# Patient Record
Sex: Female | Born: 1965
Health system: Southern US, Community
[De-identification: ages and names within clinical notes are randomized; demographics above are authoritative.]

## PROBLEM LIST (undated history)

## (undated) DIAGNOSIS — R7303 Prediabetes: Secondary | ICD-10-CM

## (undated) DIAGNOSIS — R011 Cardiac murmur, unspecified: Secondary | ICD-10-CM

## (undated) DIAGNOSIS — U071 COVID-19: Secondary | ICD-10-CM

## (undated) DIAGNOSIS — E785 Hyperlipidemia, unspecified: Secondary | ICD-10-CM

## (undated) DIAGNOSIS — R0600 Dyspnea, unspecified: Secondary | ICD-10-CM

## (undated) DIAGNOSIS — M1990 Unspecified osteoarthritis, unspecified site: Secondary | ICD-10-CM

## (undated) DIAGNOSIS — I10 Essential (primary) hypertension: Secondary | ICD-10-CM

---
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
## 2019-04-30 ENCOUNTER — Inpatient Hospital Stay (HOSPITAL_COMMUNITY)
Admission: AD | Admit: 2019-04-30 | Discharge: 2019-05-05 | DRG: 177 | Disposition: A | Discharge status: Home Health | Payer: No Typology Code available for payment source | Source: Other Acute Inpatient Hospital | Attending: Internal Medicine | Admitting: Internal Medicine

## 2019-04-30 ENCOUNTER — Other Ambulatory Visit: Payer: Self-pay

## 2019-04-30 ENCOUNTER — Encounter (HOSPITAL_COMMUNITY): Payer: Self-pay

## 2019-04-30 DIAGNOSIS — Z79899 Other long term (current) drug therapy: Secondary | ICD-10-CM

## 2019-04-30 DIAGNOSIS — E785 Hyperlipidemia, unspecified: Secondary | ICD-10-CM | POA: Diagnosis present

## 2019-04-30 DIAGNOSIS — Z6841 Body Mass Index (BMI) 40.0 and over, adult: Secondary | ICD-10-CM | POA: Diagnosis not present

## 2019-04-30 DIAGNOSIS — R7989 Other specified abnormal findings of blood chemistry: Secondary | ICD-10-CM | POA: Diagnosis not present

## 2019-04-30 DIAGNOSIS — Z794 Long term (current) use of insulin: Secondary | ICD-10-CM | POA: Diagnosis not present

## 2019-04-30 DIAGNOSIS — M1990 Unspecified osteoarthritis, unspecified site: Secondary | ICD-10-CM | POA: Diagnosis present

## 2019-04-30 DIAGNOSIS — J1289 Other viral pneumonia: Secondary | ICD-10-CM | POA: Diagnosis present

## 2019-04-30 DIAGNOSIS — J189 Pneumonia, unspecified organism: Secondary | ICD-10-CM

## 2019-04-30 DIAGNOSIS — R791 Abnormal coagulation profile: Secondary | ICD-10-CM | POA: Diagnosis not present

## 2019-04-30 DIAGNOSIS — U071 COVID-19: Secondary | ICD-10-CM | POA: Diagnosis present

## 2019-04-30 DIAGNOSIS — I5022 Chronic systolic (congestive) heart failure: Secondary | ICD-10-CM | POA: Diagnosis not present

## 2019-04-30 DIAGNOSIS — Z8249 Family history of ischemic heart disease and other diseases of the circulatory system: Secondary | ICD-10-CM

## 2019-04-30 DIAGNOSIS — J1282 Pneumonia due to coronavirus disease 2019: Secondary | ICD-10-CM | POA: Diagnosis present

## 2019-04-30 DIAGNOSIS — I10 Essential (primary) hypertension: Secondary | ICD-10-CM | POA: Diagnosis present

## 2019-04-30 DIAGNOSIS — J9601 Acute respiratory failure with hypoxia: Secondary | ICD-10-CM | POA: Diagnosis present

## 2019-04-30 DIAGNOSIS — R7303 Prediabetes: Secondary | ICD-10-CM | POA: Diagnosis present

## 2019-04-30 DIAGNOSIS — E7800 Pure hypercholesterolemia, unspecified: Secondary | ICD-10-CM | POA: Diagnosis not present

## 2019-04-30 DIAGNOSIS — M7989 Other specified soft tissue disorders: Secondary | ICD-10-CM | POA: Diagnosis not present

## 2019-04-30 DIAGNOSIS — R609 Edema, unspecified: Secondary | ICD-10-CM | POA: Diagnosis not present

## 2019-04-30 HISTORY — DX: Pneumonia due to coronavirus disease 2019: J12.82

## 2019-04-30 HISTORY — DX: Dyspnea, unspecified: R06.00

## 2019-04-30 HISTORY — DX: Cardiac murmur, unspecified: R01.1

## 2019-04-30 HISTORY — DX: Hyperlipidemia, unspecified: E78.5

## 2019-04-30 HISTORY — DX: Prediabetes: R73.03

## 2019-04-30 HISTORY — DX: Unspecified osteoarthritis, unspecified site: M19.90

## 2019-04-30 HISTORY — DX: Essential (primary) hypertension: I10

## 2019-04-30 HISTORY — DX: COVID-19: U07.1

## 2019-04-30 MED ORDER — ENOXAPARIN SODIUM 40 MG/0.4ML ~~LOC~~ SOLN: 40.0000 mg | SUBCUTANEOUS | Status: DC

## 2019-04-30 MED ORDER — ENOXAPARIN SODIUM 40 MG/0.4ML ~~LOC~~ SOLN
40.0000 mg | Freq: Every day | SUBCUTANEOUS | Status: DC
  Administered 2019-04-30: 23:00:00 40 mg via SUBCUTANEOUS
  Filled 2019-04-30: qty 0.4

## 2019-04-30 MED ORDER — CHLORHEXIDINE GLUCONATE CLOTH 2 % EX PADS
6.0000 | MEDICATED_PAD | Freq: Every day | CUTANEOUS | Status: DC
  Administered 2019-04-30 – 2019-05-04 (×5): 6 via TOPICAL

## 2019-04-30 NOTE — H&P (Signed)
History and Physical    Jamie Burton ELF:[id number] DOB: 11/25/1965 DOA: 04/30/2019  PCP: Richardean Chimera, MD   Patient coming from: [facility]  I have personally briefly reviewed patient's old medical records in [facility]  Chief Complaint: Shortness of breath.  HPI: Jamie Burton is a 53 y.o. female with medical history significant of osteoarthritis, history of dyspnea, history of childhood heart murmur, hyperlipidemia, hypertension, prediabetes, morbid obesity who was transferred from [facility] due to progressively worse dyspnea for the past 3 days associated with fatigue, malaise, chills, night sweats, but no fever.  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  She stated that she felt dyspneic on exertion for the past 3 days, but started to feel dyspneic on rest today.  She felt so dyspneic that she had to call EMS and was found to be hypoxic with an O2 sat of 70% on room air.  [place] 4 LPM only improve her O2 sat of 78%.  She had to be placed on HF [place] at 15 LPM to get her O2 sat improved to 92%.  She denies rhinorrhea, sore throat, productive or dry cough, chest pain or pressure, palpitations, dizziness, PND, orthopnea, but states she gets frequent lower extremity edema.  She has felt mildly nauseous, but denies abdominal pain, emesis, diarrhea, constipation, melena or hematochezia.  No dysuria, frequency or hematuria.  Denies polyuria, polydipsia, polyphagia or blurred vision.    ED Course: In the ER, the patient was found hypotensive with a systolic in the 70s.  She received 2 L of normal saline and her blood pressure improved to 110/70 mmHg after the fluid bolus.  She also received Solu-Medrol, full dose Lovenox for suspected DVT/PE, but this was unable to be worked up at the originating facility due to the patient being unable to fit in the CT scanner and having an inconclusive venous Doppler of the lower extremities.  Her WBC, Hb and platelets were  6.4/13.6/196 respectively Sodium 135, potassium 3.8, chloride 97 and CO2 18 mmol/L. Glucose was 218, creatinine 2.15 and BUN 26 mg/dL.  Initial lactic lactic acid 4.7 then 2.1. Pro-BNP 11257.  ABG results were pH 7.45, PCO2 32, PO2 64, bicarbonate 22.2 with an O2 sat of 92.2% on 15 LPM HFNC.  Her EKG showed EKG Sinus tachycardia with diffuse abnormal T wave.  She received dexamethasone 6 mg and Solu-Medrol 125 mg IVP.  Cefepime 2 g IVPB. She received a dose of Lovenox of 140 milligrams SQ at 1734 and had received another 40 mg  earlier.   Review of Systems: As per HPI otherwise 10 point review of systems negative.   Past Medical History:  Diagnosis Date  . Arthritis   . Dyspnea   . Heart murmur    told she had a heart murmur as a child but unsure now  . Hyperlipidemia   . Hypertension   . Pneumonia due to COVID-19 virus 04/30/2019  . Pre-diabetes     History reviewed. No pertinent surgical history.   reports that she has never smoked. She has never used smokeless tobacco. She reports that she does not drink alcohol or use drugs.  No Known Allergies  Family medical history Nothing that she can remember on her parents. She is an only child. Multiple extended family members have a history of hypertension.  Prior to Admission medications   Medication Sig Start Date End Date Taking? Authorizing Provider  atenolol (TENORMIN) 50 MG tablet Take 50 mg by mouth  daily.   Yes [provider]  hydrochlorothiazide (HYDRODIURIL) 25 MG tablet Take 25 mg by mouth daily.   Yes [provider]  lovastatin (MEVACOR) 20 MG tablet Take 20 mg by mouth 2 (two) times daily.   Yes [provider]    Physical Exam: Vitals:   04/30/19 2016 04/30/19 2023 04/30/19 2100 04/30/19 2200  BP:  117/88 114/85 114/77  Pulse:  95 (!) 104 93  Resp:  (!) 28 (!) 21 (!) 34  Temp:  97.7 F (36.5 C)    TempSrc:  Oral    SpO2: 93% 96% 97% 96%  Height:  5\' 5"  (1.651 m)       Constitutional: NAD, calm, comfortable Eyes: PERRL, lids and conjunctivae normal ENMT: Mucous membranes are moist. Posterior pharynx clear of any exudate or lesions.  Neck: normal, supple, no masses, no thyromegaly Respiratory: Tachypneic in the mid 20s.  Decreased breath sounds with bibasilar crackles. No accessory muscle use.  Cardiovascular: Regular rate and rhythm, no murmurs / rubs / gallops. No pitting lower extremities edema.  Stage II lymphedema.  2+ pedal pulses. No carotid bruits.  Abdomen: Morbidly obese, nondistended.  Soft, no tenderness, no masses palpated. No hepatosplenomegaly. Bowel sounds positive.  Musculoskeletal: no clubbing / cyanosis.  Good ROM, no contractures. Normal muscle tone.  Skin: no rashes, lesions, ulcers on very limited dermatological examination. Neurologic: CN 2-12 grossly intact. Sensation intact, DTR normal. Strength 5/5 in all 4.  Psychiatric: Normal judgment and insight. Alert and oriented x 3. Normal mood.   Labs on Admission: I have personally reviewed following labs and imaging studies  CBC: No results for input(s): WBC, NEUTROABS, HGB, HCT, MCV, PLT in the last 168 hours. Basic Metabolic Panel: No results for input(s): NA, K, CL, CO2, GLUCOSE, BUN, CREATININE, CALCIUM, MG, PHOS in the last 168 hours. GFR: CrCl cannot be calculated (No successful lab value found.). Liver Function Tests: No results for input(s): AST, ALT, ALKPHOS, BILITOT, PROT, ALBUMIN in the last 168 hours. No results for input(s): LIPASE, AMYLASE in the last 168 hours. No results for input(s): AMMONIA in the last 168 hours. Coagulation Profile: No results for input(s): INR, PROTIME in the last 168 hours. Cardiac Enzymes: No results for input(s): CKTOTAL, CKMB, CKMBINDEX, TROPONINI in the last 168 hours. BNP (last 3 results) No results for input(s): PROBNP in the last 8760 hours. HbA1C: No results for input(s): HGBA1C in the last 72 hours. CBG: No results for input(s):  GLUCAP in the last 168 hours. Lipid Profile: No results for input(s): CHOL, HDL, LDLCALC, TRIG, CHOLHDL, LDLDIRECT in the last 72 hours. Thyroid Function Tests: No results for input(s): TSH, T4TOTAL, FREET4, T3FREE, THYROIDAB in the last 72 hours. Anemia Panel: No results for input(s): VITAMINB12, FOLATE, FERRITIN, TIBC, IRON, RETICCTPCT in the last 72 hours. Urine analysis: No results found for: COLORURINE, APPEARANCEUR, LABSPEC, PHURINE, GLUCOSEU, HGBUR, BILIRUBINUR, KETONESUR, PROTEINUR, UROBILINOGEN, NITRITE, LEUKOCYTESUR  Radiological Exams on Admission: No results found.  EKG: Independently reviewed.  Assessment/Plan Principal Problem:   Pneumonia due to COVID-19 virus Admit to ICU/inpatient. Continue supplemental oxygen. Bronchodilators every 6 hours. Continue dexamethasone 8 mg IVP daily. Continue daily Zithromax 500 mg IVPB x4 more days. Continue daily Rocephin 2 g IVPB x4 more days. Follow-up CBC, CMP, inflammatory markers.  Active Problems:   Elevated d-dimer Unable to get CTA originating facility. Doppler lower extremities was not conclusive. Continue full dose Lovenox for now. CT scanner at our facility is currently on maintenance. Check lower extremities Doppler later  today.    Hypertension Continue atenolol 50 mg p.o. daily. Continue HCTZ 25 mg p.o. daily. Monitor BP, HR, renal function electrolytes.    Hyperlipidemia Continue statin.    DVT prophylaxis: Lovenox full dose. Code Status: Full code. Family Communication:  Disposition Plan: Admit for COVID-19 pneumonia treatment. Consults called: Admission status: Inpatient/ICU.   Reubin Milan MD [facility]  If 7PM-7AM, please contact night-coverage [email or web address]  04/30/2019, 11:15 PM   This document was prepared using Dragon voice recognition software and may contain some unintended transcription errors.

## 2019-05-01 ENCOUNTER — Inpatient Hospital Stay (HOSPITAL_COMMUNITY): Payer: No Typology Code available for payment source

## 2019-05-01 ENCOUNTER — Encounter (HOSPITAL_COMMUNITY): Payer: Self-pay | Admitting: Internal Medicine

## 2019-05-01 ENCOUNTER — Inpatient Hospital Stay (HOSPITAL_COMMUNITY): Payer: 59

## 2019-05-01 DIAGNOSIS — R791 Abnormal coagulation profile: Secondary | ICD-10-CM

## 2019-05-01 DIAGNOSIS — R609 Edema, unspecified: Secondary | ICD-10-CM

## 2019-05-01 DIAGNOSIS — I5022 Chronic systolic (congestive) heart failure: Secondary | ICD-10-CM

## 2019-05-01 DIAGNOSIS — J9601 Acute respiratory failure with hypoxia: Secondary | ICD-10-CM

## 2019-05-01 DIAGNOSIS — R7989 Other specified abnormal findings of blood chemistry: Secondary | ICD-10-CM

## 2019-05-01 DIAGNOSIS — E7800 Pure hypercholesterolemia, unspecified: Secondary | ICD-10-CM

## 2019-05-01 DIAGNOSIS — I10 Essential (primary) hypertension: Secondary | ICD-10-CM

## 2019-05-01 DIAGNOSIS — M7989 Other specified soft tissue disorders: Secondary | ICD-10-CM

## 2019-05-01 LAB — FERRITIN
Ferritin: 345 ng/mL — ABNORMAL HIGH (ref 11–307)
Ferritin: 414 ng/mL — ABNORMAL HIGH (ref 11–307)

## 2019-05-01 LAB — CBC WITH DIFFERENTIAL/PLATELET
Abs Immature Granulocytes: 0.08 10*3/uL — ABNORMAL HIGH (ref 0.00–0.07)
Abs Immature Granulocytes: 0.11 10*3/uL — ABNORMAL HIGH (ref 0.00–0.07)
Basophils Absolute: 0 10*3/uL (ref 0.0–0.1)
Basophils Absolute: 0 10*3/uL (ref 0.0–0.1)
Basophils Relative: 0 %
Basophils Relative: 0 %
Eosinophils Absolute: 0 10*3/uL (ref 0.0–0.5)
Eosinophils Absolute: 0 10*3/uL (ref 0.0–0.5)
Eosinophils Relative: 0 %
Eosinophils Relative: 0 %
HCT: 42.8 % (ref 36.0–46.0)
HCT: 43.6 % (ref 36.0–46.0)
Hemoglobin: 13.4 g/dL (ref 12.0–15.0)
Hemoglobin: 13.7 g/dL (ref 12.0–15.0)
Immature Granulocytes: 1 %
Immature Granulocytes: 1 %
Lymphocytes Relative: 12 %
Lymphocytes Relative: 9 %
Lymphs Abs: 0.8 10*3/uL (ref 0.7–4.0)
Lymphs Abs: 1.1 10*3/uL (ref 0.7–4.0)
MCH: 26.7 pg (ref 26.0–34.0)
MCH: 26.9 pg (ref 26.0–34.0)
MCHC: 31.3 g/dL (ref 30.0–36.0)
MCHC: 31.4 g/dL (ref 30.0–36.0)
MCV: 84.8 fL (ref 80.0–100.0)
MCV: 85.8 fL (ref 80.0–100.0)
Monocytes Absolute: 0.5 10*3/uL (ref 0.1–1.0)
Monocytes Absolute: 1.1 10*3/uL — ABNORMAL HIGH (ref 0.1–1.0)
Monocytes Relative: 10 %
Monocytes Relative: 7 %
Neutro Abs: 5.5 10*3/uL (ref 1.7–7.7)
Neutro Abs: 8.9 10*3/uL — ABNORMAL HIGH (ref 1.7–7.7)
Neutrophils Relative %: 80 %
Neutrophils Relative %: 80 %
Platelets: 176 10*3/uL (ref 150–400)
Platelets: 180 10*3/uL (ref 150–400)
RBC: 4.99 MIL/uL (ref 3.87–5.11)
RBC: 5.14 MIL/uL — ABNORMAL HIGH (ref 3.87–5.11)
RDW: 14.6 % (ref 11.5–15.5)
RDW: 14.6 % (ref 11.5–15.5)
WBC: 11.2 10*3/uL — ABNORMAL HIGH (ref 4.0–10.5)
WBC: 6.9 10*3/uL (ref 4.0–10.5)
nRBC: 0 % (ref 0.0–0.2)
nRBC: 0.3 % — ABNORMAL HIGH (ref 0.0–0.2)

## 2019-05-01 LAB — COMPREHENSIVE METABOLIC PANEL
ALT: 36 U/L (ref 0–44)
AST: 41 U/L (ref 15–41)
Albumin: 3.6 g/dL (ref 3.5–5.0)
Alkaline Phosphatase: 72 U/L (ref 38–126)
Anion gap: 14 (ref 5–15)
BUN: 32 mg/dL — ABNORMAL HIGH (ref 6–20)
CO2: 22 mmol/L (ref 22–32)
Calcium: 8.7 mg/dL — ABNORMAL LOW (ref 8.9–10.3)
Chloride: 102 mmol/L (ref 98–111)
Creatinine, Ser: 1.46 mg/dL — ABNORMAL HIGH (ref 0.44–1.00)
GFR calc Af Amer: 48 mL/min — ABNORMAL LOW (ref >60)
GFR calc non Af Amer: 41 mL/min — ABNORMAL LOW (ref >60)
Glucose, Bld: 180 mg/dL — ABNORMAL HIGH (ref 70–99)
Potassium: 4.4 mmol/L (ref 3.5–5.1)
Sodium: 138 mmol/L (ref 135–145)
Total Bilirubin: 1.2 mg/dL (ref 0.3–1.2)
Total Protein: 8.9 g/dL — ABNORMAL HIGH (ref 6.5–8.1)

## 2019-05-01 LAB — MAGNESIUM
Magnesium: 2.6 mg/dL — ABNORMAL HIGH (ref 1.7–2.4)
Magnesium: 2.6 mg/dL — ABNORMAL HIGH (ref 1.7–2.4)

## 2019-05-01 LAB — C-REACTIVE PROTEIN
CRP: 11.2 mg/dL — ABNORMAL HIGH (ref <1.0)
CRP: 11.9 mg/dL — ABNORMAL HIGH (ref <1.0)

## 2019-05-01 LAB — GLUCOSE, CAPILLARY
Glucose-Capillary: 151 mg/dL — ABNORMAL HIGH (ref 70–99)
Glucose-Capillary: 162 mg/dL — ABNORMAL HIGH (ref 70–99)
Glucose-Capillary: 208 mg/dL — ABNORMAL HIGH (ref 70–99)

## 2019-05-01 LAB — TYPE AND SCREEN
ABO/RH(D): O POS
Antibody Screen: NEGATIVE

## 2019-05-01 LAB — LACTIC ACID, PLASMA: Lactic Acid, Venous: 2 mmol/L (ref 0.5–1.9)

## 2019-05-01 LAB — ECHOCARDIOGRAM LIMITED: Height: 65 in

## 2019-05-01 LAB — BRAIN NATRIURETIC PEPTIDE: B Natriuretic Peptide: 1317.4 pg/mL — ABNORMAL HIGH (ref 0.0–100.0)

## 2019-05-01 LAB — HEMOGLOBIN A1C
Hgb A1c MFr Bld: 6.6 % — ABNORMAL HIGH (ref 4.8–5.6)
Mean Plasma Glucose: 142.72 mg/dL

## 2019-05-01 LAB — PROCALCITONIN: Procalcitonin: 0.17 ng/mL

## 2019-05-01 LAB — HEPARIN LEVEL (UNFRACTIONATED): Heparin Unfractionated: 0.44 IU/mL (ref 0.30–0.70)

## 2019-05-01 LAB — HIV ANTIBODY (ROUTINE TESTING W REFLEX): HIV Screen 4th Generation wRfx: NONREACTIVE

## 2019-05-01 LAB — MRSA PCR SCREENING: MRSA by PCR: NEGATIVE

## 2019-05-01 LAB — PHOSPHORUS
Phosphorus: 3.2 mg/dL (ref 2.5–4.6)
Phosphorus: 3.5 mg/dL (ref 2.5–4.6)

## 2019-05-01 LAB — FIBRINOGEN: Fibrinogen: 419 mg/dL (ref 210–475)

## 2019-05-01 LAB — D-DIMER, QUANTITATIVE
D-Dimer, Quant: >20 ug/mL-FEU — ABNORMAL HIGH (ref 0.00–0.50)
D-Dimer, Quant: >20 ug/mL-FEU — ABNORMAL HIGH (ref 0.00–0.50)

## 2019-05-01 LAB — ABO/RH: ABO/RH(D): O POS

## 2019-05-01 MED ORDER — SODIUM CHLORIDE 0.9 % IV SOLN
200.0000 mg | Freq: Once | INTRAVENOUS | Status: DC
  Filled 2019-05-01: qty 40

## 2019-05-01 MED ORDER — SODIUM CHLORIDE 0.9 % IV SOLN
500.0000 mg | INTRAVENOUS | Status: DC
  Administered 2019-05-01 – 2019-05-02 (×2): 500 mg via INTRAVENOUS
  Filled 2019-05-01 (×2): qty 500

## 2019-05-01 MED ORDER — ENOXAPARIN SODIUM 150 MG/ML ~~LOC~~ SOLN: 1.0000 mg/kg | Freq: Every day | SUBCUTANEOUS | Status: DC

## 2019-05-01 MED ORDER — SODIUM CHLORIDE 0.9 % IV SOLN: 100.0000 mg | INTRAVENOUS | Status: DC

## 2019-05-01 MED ORDER — INSULIN ASPART 100 UNIT/ML ~~LOC~~ SOLN
0.0000 [IU] | Freq: Three times a day (TID) | SUBCUTANEOUS | Status: DC
  Administered 2019-05-01: 7 [IU] via SUBCUTANEOUS
  Administered 2019-05-01 – 2019-05-02 (×4): 4 [IU] via SUBCUTANEOUS
  Administered 2019-05-02: 18:00:00 7 [IU] via SUBCUTANEOUS
  Administered 2019-05-02: 11 [IU] via SUBCUTANEOUS
  Administered 2019-05-03 (×3): 4 [IU] via SUBCUTANEOUS
  Administered 2019-05-04: 13:00:00 7 [IU] via SUBCUTANEOUS
  Administered 2019-05-04: 3 [IU] via SUBCUTANEOUS
  Administered 2019-05-04: 7 [IU] via SUBCUTANEOUS
  Administered 2019-05-05: 3 [IU] via SUBCUTANEOUS

## 2019-05-01 MED ORDER — SODIUM CHLORIDE 0.9 % IV SOLN
200.0000 mg | Freq: Once | INTRAVENOUS | Status: AC
  Administered 2019-05-01: 200 mg via INTRAVENOUS
  Filled 2019-05-01: qty 40

## 2019-05-01 MED ORDER — IPRATROPIUM-ALBUTEROL 20-100 MCG/ACT IN AERS
2.0000 | INHALATION_SPRAY | Freq: Four times a day (QID) | RESPIRATORY_TRACT | Status: DC | PRN
  Filled 2019-05-01: qty 4

## 2019-05-01 MED ORDER — SODIUM CHLORIDE 0.9 % IV SOLN
100.0000 mg | INTRAVENOUS | Status: DC
  Administered 2019-05-02 – 2019-05-04 (×3): 100 mg via INTRAVENOUS
  Filled 2019-05-01 (×4): qty 20

## 2019-05-01 MED ORDER — SODIUM CHLORIDE 0.9% FLUSH
10.0000 mL | INTRAVENOUS | Status: DC | PRN
  Administered 2019-05-05: 09:00:00 10 mL
  Filled 2019-05-01: qty 40

## 2019-05-01 MED ORDER — METHYLPREDNISOLONE SODIUM SUCC 125 MG IJ SOLR
60.0000 mg | Freq: Three times a day (TID) | INTRAMUSCULAR | Status: DC
  Administered 2019-05-01 – 2019-05-02 (×4): 60 mg via INTRAVENOUS
  Filled 2019-05-01 (×4): qty 2

## 2019-05-01 MED ORDER — SODIUM CHLORIDE 0.9 % IV SOLN
2.0000 g | INTRAVENOUS | Status: DC
  Administered 2019-05-01 – 2019-05-02 (×2): 2 g via INTRAVENOUS
  Filled 2019-05-01 (×3): qty 20

## 2019-05-01 MED ORDER — SODIUM CHLORIDE 0.9% FLUSH
10.0000 mL | Freq: Two times a day (BID) | INTRAVENOUS | Status: DC
  Administered 2019-05-01 – 2019-05-02 (×4): 10 mL

## 2019-05-01 MED ORDER — HEPARIN (PORCINE) 25000 UT/250ML-% IV SOLN
1650.0000 [IU]/h | INTRAVENOUS | Status: AC
  Administered 2019-05-01 – 2019-05-03 (×4): 1650 [IU]/h via INTRAVENOUS
  Filled 2019-05-01 (×6): qty 250

## 2019-05-01 MED ORDER — HEPARIN BOLUS VIA INFUSION
2000.0000 [IU] | Freq: Once | INTRAVENOUS | Status: AC
  Administered 2019-05-01: 2000 [IU] via INTRAVENOUS
  Filled 2019-05-01: qty 2000

## 2019-05-01 MED ORDER — PROCHLORPERAZINE EDISYLATE 10 MG/2ML IJ SOLN
10.0000 mg | INTRAMUSCULAR | Status: DC | PRN
  Filled 2019-05-01: qty 2

## 2019-05-01 MED ORDER — DEXAMETHASONE SODIUM PHOSPHATE 10 MG/ML IJ SOLN: 8.0000 mg | INTRAMUSCULAR | Status: DC

## 2019-05-01 MED ORDER — IPRATROPIUM-ALBUTEROL 20-100 MCG/ACT IN AERS
2.0000 | INHALATION_SPRAY | Freq: Four times a day (QID) | RESPIRATORY_TRACT | Status: DC
  Administered 2019-05-01: 06:00:00 2 via RESPIRATORY_TRACT
  Filled 2019-05-01: qty 4

## 2019-05-01 MED ORDER — FUROSEMIDE 10 MG/ML IJ SOLN
40.0000 mg | Freq: Once | INTRAMUSCULAR | Status: AC
  Administered 2019-05-01: 14:00:00 40 mg via INTRAVENOUS
  Filled 2019-05-01: qty 4

## 2019-05-01 NOTE — Progress Notes (Signed)
ANTICOAGULATION CONSULT NOTE - Initial Consult  Pharmacy Consult for heparin Indication: R/o VTE treatment  No Known Allergies  Patient Measurements: Height: 5\' 5"  (165.1 cm) Weight: (!) 480 lb (217.7 kg) IBW/kg (Calculated) : 57  Actual body weight: 217 Heparin Dosing Weight: 114 kg   Vital Signs: Temp: 98.6 F (37 C) (09/02 1900) Temp Source: Oral (09/02 1900) BP: 106/71 (09/02 1900) Pulse Rate: 78 (09/02 1600)  Labs: Recent Labs    04/30/19 2330 05/01/19 0855 05/01/19 2040  HGB 13.7 13.4  --   HCT 43.6 42.8  --   PLT 176 180  --   HEPARINUNFRC  --   --  0.44  CREATININE 1.46*  --   --     Estimated Creatinine Clearance: 87.3 mL/min (A) (by C-G formula based on SCr of 1.46 mg/dL (H)).   Medical History: Past Medical History:  Diagnosis Date  . Arthritis   . Dyspnea   . Heart murmur    told she had a heart murmur as a child but unsure now  . Hyperlipidemia   . Hypertension   . Pneumonia due to COVID-19 virus 04/30/2019  . Pre-diabetes     Medications:  Medications Prior to Admission  Medication Sig Dispense Refill Last Dose  . atenolol (TENORMIN) 50 MG tablet Take 50 mg by mouth daily.   04/28/2019 at 0700  . hydrochlorothiazide (HYDRODIURIL) 25 MG tablet Take 25 mg by mouth daily.   04/28/2019 at Unknown time  . ibuprofen (ADVIL) 200 MG tablet Take 200 mg by mouth every 6 (six) hours as needed for moderate pain.   unk  . lovastatin (MEVACOR) 20 MG tablet Take 20 mg by mouth 2 (two) times daily.   04/28/2019 at Unknown time    Assessment: 68 YOF with concern for LE VTE to start on IV heparin. H/H and Plt wnl. D-dimer > 20. SCr 1.46. Of note she received a total of Lovenox 180 mg yesterday evening.   PM update:  HL is 0.44 therapeutic  No line or bleeding issues per RN   Goal of Therapy:  Heparin level 0.3-0.7 units/ml Monitor platelets by anticoagulation protocol: Yes   Plan:   Continue  IV heparin at 1650 units/hr  F/u HL with AM labs    Monitor daily HL, CBC and s/s of bleeding    Royetta Asal, PharmD, BCPS 05/01/2019 10:06 PM

## 2019-05-01 NOTE — Progress Notes (Signed)
  Echocardiogram 2D Echocardiogram limited has been performed.  Darlina Sicilian M 05/01/2019, 10:23 AM

## 2019-05-01 NOTE — Progress Notes (Signed)
NAME:  Jamie Burton, MRN:  [id number], DOB:  28-Aug-1966, LOS: 1 ADMISSION DATE:  04/30/2019, CONSULTATION DATE:  9/2 REFERRING MD:  Olevia Bowens, CHIEF COMPLAINT:  Dyspnea   Brief History   53 y/o female admitted from [facility] on 9/2 in the setting of severe acute respiratory failure with hypoxemia from COVID 19 pneumonia.   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  She says that she started to develop symptoms approximately 10 days ago, initially sinus congestion and postnasal drip.  She had a test done 6 days ago which was positive for COVID-19 pneumonia.  There has been an outbreak in her facility.  Over the last 3 days she developed worsening cough and shortness of breath.  Yesterday she had to go to the emergency room because her shortness of breath was so severe.  They are in the emergency room she was noted to have profound hypoxemia.  She also complains of worsening leg swelling over the last several days. Overnight she was moved to the intensive care unit, she says that she is resting comfortably now.  Her breathing has improved. Past Medical History  Osteoarthritis HTN HLD Pre-diabetes  Significant Hospital Events   9/1 admission  Consults:  PCCM  Procedures:    Significant Diagnostic Tests:  9/2 Bilateral lower ext dop> limited study, no DVT 9/2 Echo> LVEF > 65%, cavity size normal, RV normal size/function, valves OK  Micro Data:  8/27 SARS COV 2 EDEN > positive  Antimicrobials:  9/2 solumedrol 9/2 remdesivir  9/1 ceftriaxone >  9/1 azithro >   Interim history/subjective:  As above  Objective   Blood pressure 108/74, pulse (!) 102, temperature 98.2 F (36.8 C), temperature source Oral, resp. rate (!) 31, height 5\' 5"  (1.651 m), SpO2 (!) 89 %.    FiO2 (%):  [85 %-100 %] 85 %  No intake or  output data in the 24 hours ending 05/01/19 0743 There were no vitals filed for this visit.  Examination:  General:  Resting comfortably in chair HENT: NCAT OP clear PULM: CTA B, normal effort CV: RRR, no mgr GI: BS+, soft, nontender MSK: normal bulk and tone Neuro: awake, alert, no distress, MAEW   Resolved Hospital Problem list     Assessment & Plan:  COVID 19 pneumonia causing acute respiratory failure with hypoxemia Start remdedsivir Hold off on actemra: she seems to be getting better Continue solumedrol for now, consider lowering dose Tolerate periods of hypoxemia, goal at rest is greater than 85% SaO2, with movement ideally above 75% Decision for intubation should be based on a change in mental status or physical evidence of ventilatory failure such as nasal flaring, accessory muscle use, paradoxical breathing Out of bed to chair as able Incentive spirometry is important, use every hour Prone positioning while in bed  Elevated d-dimer with leg swelling: agree with vascular  > heparin infusion for now > will discuss with TRH, now knowing neg DVT study and echo, doubt thromboembolism and could probably change back to lovenox 0.5mg  subq bid  CHF? > check echo > Lasix x1 dose   Best practice:  Diet: advance diet Pain/Anxiety/Delirium protocol (if indicated): n/a VAP protocol (if indicated): n/a DVT prophylaxis: heparin GI prophylaxis: n/a Glucose control: SSI Mobility: out of bed Code Status: full Family Communication: will discuss with TRH Disposition:  consider transfer to PCU tomorrow  Labs   CBC: Recent Labs  Lab 04/30/19 2330  WBC 6.9  NEUTROABS 5.5  HGB 13.7  HCT 43.6  MCV 84.8  PLT 176    Basic Metabolic Panel: Recent Labs  Lab 04/30/19 2330  NA 138  K 4.4  CL 102  CO2 22  GLUCOSE 180*  BUN 32*  CREATININE 1.46*  CALCIUM 8.7*  MG 2.6*  PHOS 3.5   GFR: CrCl cannot be calculated (Unknown ideal weight.). Recent Labs  Lab 04/30/19  2330  WBC 6.9    Liver Function Tests: Recent Labs  Lab 04/30/19 2330  AST 41  ALT 36  ALKPHOS 72  BILITOT 1.2  PROT 8.9*  ALBUMIN 3.6   No results for input(s): LIPASE, AMYLASE in the last 168 hours. No results for input(s): AMMONIA in the last 168 hours.  ABG No results found for: PHART, PCO2ART, PO2ART, HCO3, TCO2, ACIDBASEDEF, O2SAT   Coagulation Profile: No results for input(s): INR, PROTIME in the last 168 hours.  Cardiac Enzymes: No results for input(s): CKTOTAL, CKMB, CKMBINDEX, TROPONINI in the last 168 hours.  HbA1C: No results found for: HGBA1C  CBG: No results for input(s): GLUCAP in the last 168 hours.  Review of Systems:   Gen: Denies fever, chills, weight change, fatigue, night sweats HEENT: Denies blurred vision, double vision, hearing loss, tinnitus, + sinus congestion, + rhinorrhea, sore throat, neck stiffness, dysphagia PULM: per HPI CV: Denies chest pain, edema, orthopnea, paroxysmal nocturnal dyspnea, palpitations GI: Denies abdominal pain, nausea, vomiting, diarrhea, hematochezia, melena, constipation, change in bowel habits GU: Denies dysuria, hematuria, polyuria, oliguria, urethral discharge Endocrine: Denies hot or cold intolerance, polyuria, polyphagia or appetite change Derm: Denies rash, dry skin, scaling or peeling skin change Heme: Denies easy bruising, bleeding, bleeding gums Neuro: Denies headache, numbness, weakness, slurred speech, loss of memory or consciousness   Past Medical History  She,  has a past medical history of Arthritis, Dyspnea, Heart murmur, Hyperlipidemia, Hypertension, Pneumonia due to COVID-19 virus (04/30/2019), and Pre-diabetes.   Surgical History   History reviewed. No pertinent surgical history.   Social History   reports that she has never smoked. She has never used smokeless tobacco. She reports that she does not drink alcohol or use drugs.   Family History   Her family history includes Hypertension in an  other family member.   Allergies No Known Allergies   Home Medications  Prior to Admission medications   Medication Sig Start Date End Date Taking? Authorizing Provider  atenolol (TENORMIN) 50 MG tablet Take 50 mg by mouth daily.   Yes [provider]  hydrochlorothiazide (HYDRODIURIL) 25 MG tablet Take 25 mg by mouth daily.   Yes [provider]  lovastatin (MEVACOR) 20 MG tablet Take 20 mg by mouth 2 (two) times daily.   Yes [provider]     Critical care time: 40 minutes     Heber CarolinaBrent Sudie Bandel, MD [place] [facility] Pager: [phone] Cell: [phone] If no response, call [phone]

## 2019-05-01 NOTE — Progress Notes (Signed)
Midline placed in the left upper arm/cephalic vein without difficulty. Good blood return.

## 2019-05-01 NOTE — Progress Notes (Signed)
Spoke with RN concerning emergency PIV needed, education provided. Will not place PIV at this time, RN aware to consult if patient looses PIV.

## 2019-05-01 NOTE — Progress Notes (Signed)
Updated pt's mother and aunt of pt's status. Informed pt's mother and aunt of new medications the pt is receiving and current oxygen therapy. Informed family that if any further questions were to arise that they should feel free to reach out. Pt's family stated understanding of information provided

## 2019-05-01 NOTE — Progress Notes (Addendum)
PROGRESS NOTE    Jamie Burton  YFV:[id number] DOB: 25-Apr-1966 DOA: 04/30/2019 PCP: Richardean Chimera, MD   Brief Narrative:  Jamie Burton is a 53 y.o. BF PMHx OA, dyspnea, childhood heart murmur, HTN, HLD,  prediabetes, morbid obesity,    Transferred from [facility] due to progressively worse dyspnea for the past 3 days associated with fatigue, malaise, chills, night sweats, but no fever.  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  She stated that she felt dyspneic on exertion for the past 3 days, but started to feel dyspneic on rest today.  She felt so dyspneic that she had to call EMS and was found to be hypoxic with an O2 sat of 70% on room air.  [place] 4 LPM only improve her O2 sat of 78%.  She had to be placed on HF [place] at 15 LPM to get her O2 sat improved to 92%.  She denies rhinorrhea, sore throat, productive or dry cough, chest pain or pressure, palpitations, dizziness, PND, orthopnea, but states she gets frequent lower extremity edema.  She has felt mildly nauseous, but denies abdominal pain, emesis, diarrhea, constipation, melena or hematochezia.  No dysuria, frequency or hematuria.  Denies polyuria, polydipsia, polyphagia or blurred vision.    ED Course: In the ER, the patient was found hypotensive with a systolic in the 70s.  She received 2 L of normal saline and her blood pressure improved to 110/70 mmHg after the fluid bolus.  She also received Solu-Medrol, full dose Lovenox for suspected DVT/PE, but this was unable to be worked up at the originating facility due to the patient being unable to fit in the CT scanner and having an inconclusive venous Doppler of the lower extremities.   Subjective: 9/2 states started feeling bad on Thursday.  Lives at home alone.  Positive cough, S OB, which were the symptoms that prompted her to seek treatment at [facility].    Assessment & Plan:   Principal Problem:   Pneumonia due to COVID-19 virus Active  Problems:   Hypertension   Hyperlipidemia   Elevated d-dimer  Acute respiratory failure with hypoxia/pneumonia due to COVID 19 virus - Patient with new onset O2 requirement (25 L/min), FiO2 70%, SPO2 95% -Titrate O2 to maintain SPO2> 89% -Solu-Medrol 60 mg TID - Patient with new extremely large onset O2, although no chest x-ray to show bilateral infiltrates given her huge O2 demand most likely will show signs of "pneumonia.  Start Remdesivir per pharmacy protocol - Combivent QID Recent Labs  Lab 04/30/19 2330 05/01/19 0155  CRP 11.9* 11.2*  ] Recent Labs  Lab 04/30/19 2330  DDIMER >20.00*  - PCXR; bilateral patchy opacification; see results below - Given patient's new O2 demand, and CRP > 7 qualifies for Actemra.  However patient O2 demand decreasing.  Therefore will hold at this time.   -Counseled patient use of Actemra is off label, patient stated negative history of hepatitis, TB, or need for immunosuppressant medication.  Stated if required she would except treatment with Actemra. -Afebrile last 24 hours, mildleukocytosis highly convinced that patient has a bacterial component to her pneumonia however will continue antibiotics until procalcitonin and lactic acid values returned.  Elevated d-dimer/suspected DVT/PE  - Unable to be worked up at [facility], secondary to patient's size -9/2 CT scanner at [facility] down -Continue heparin drip   HTN/CHF? - Per patient lower extremity swelling which would be consistent with CHF. - Echocardiogram pending - Not on diuretic  at home - Lasix 40 mg x 1 - Currently patient's BP controlled without medication monitor closely  HLD -Lipid panel pending     DVT prophylaxis: Heparin drip Code Status: Full Family Communication: 9/2 spoke with Pryor Curia (mother) counseled her on plan of care answered all questions Disposition Plan: TBD   Consultants:  PCCM     Procedures/Significant Events:  9/2 PCXR;-slight progression of  bibasilar airspace opacities compatible with COVID-19 pneumonia. -Cardiomegaly without failure.    I have personally reviewed and interpreted all radiology studies and my findings are as above.  VENTILATOR SETTINGS: HFNC O2 flow rate; 25 L/min FiO2; 70% SPO2; 94%   Cultures   Antimicrobials: Anti-infectives (From admission, onward)   Start     Stop   05/02/19 1000  remdesivir 100 mg in sodium chloride 0.9 % 250 mL IVPB     05/06/19 0959   05/02/19 0900  remdesivir 100 mg in sodium chloride 0.9 % 250 mL IVPB  Status:  Discontinued     05/01/19 0817   05/01/19 1200  cefTRIAXone (ROCEPHIN) 2 g in sodium chloride 0.9 % 100 mL IVPB     05/05/19 1159   05/01/19 1200  azithromycin (ZITHROMAX) 500 mg in sodium chloride 0.9 % 250 mL IVPB     05/05/19 1159   05/01/19 1000  remdesivir 200 mg in sodium chloride 0.9 % 250 mL IVPB     05/01/19 1059   05/01/19 0900  remdesivir 200 mg in sodium chloride 0.9 % 250 mL IVPB  Status:  Discontinued     05/01/19 0817       Devices    LINES / TUBES:      Continuous Infusions: . azithromycin    . cefTRIAXone (ROCEPHIN)  IV       Objective: Vitals:   05/01/19 0400 05/01/19 0445 05/01/19 0500 05/01/19 0600  BP: 111/85  119/73 110/77  Pulse:      Resp: (!) 31  (!) 31 (!) 31  Temp: 98.2 F (36.8 C)     TempSrc: Oral     SpO2: 97% 95% 95% 93%  Height:       No intake or output data in the 24 hours ending 05/01/19 0726 There were no vitals filed for this visit.  Examination:  General: A/O x4, positive acute respiratory distress Eyes: negative scleral hemorrhage, negative anisocoria, negative icterus ENT: Negative Runny nose, negative gingival bleeding, Neck:  Negative scars, masses, torticollis, lymphadenopathy, JVD Lungs: Tachypneic, decreased breath sounds diffusely, positive cough with deep inspiration, without wheezes or crackles Cardiovascular: Regular rate and rhythm without murmur gallop or rub normal S1 and S2  Abdomen: MORBIDLY OBESE, negative abdominal pain, nondistended, positive soft, bowel sounds, no rebound, no ascites, no appreciable mass Extremities: No significant cyanosis, clubbing, secondary to body habitus difficult to determine extremity edema but appears mildly edematous 1-2+  Skin: Negative rashes, lesions, ulcers Psychiatric:  Negative depression, negative anxiety, negative fatigue, negative mania  Central nervous system:  Cranial nerves II through XII intact, tongue/uvula midline, all extremities muscle strength 5/5, sensation intact throughout, negative dysarthria, negative expressive aphasia, negative receptive aphasia.  .     Data Reviewed: Care during the described time interval was provided by me .  I have reviewed this patient's available data, including medical history, events of note, physical examination, and all test results as part of my evaluation.   CBC: Recent Labs  Lab 04/30/19 2330  WBC 6.9  NEUTROABS 5.5  HGB 13.7  HCT 43.6  MCV 84.8  PLT 176   Basic Metabolic Panel: Recent Labs  Lab 04/30/19 2330  NA 138  K 4.4  CL 102  CO2 22  GLUCOSE 180*  BUN 32*  CREATININE 1.46*  CALCIUM 8.7*  MG 2.6*  PHOS 3.5   GFR: CrCl cannot be calculated (Unknown ideal weight.). Liver Function Tests: Recent Labs  Lab 04/30/19 2330  AST 41  ALT 36  ALKPHOS 72  BILITOT 1.2  PROT 8.9*  ALBUMIN 3.6   No results for input(s): LIPASE, AMYLASE in the last 168 hours. No results for input(s): AMMONIA in the last 168 hours. Coagulation Profile: No results for input(s): INR, PROTIME in the last 168 hours. Cardiac Enzymes: No results for input(s): CKTOTAL, CKMB, CKMBINDEX, TROPONINI in the last 168 hours. BNP (last 3 results) No results for input(s): PROBNP in the last 8760 hours. HbA1C: No results for input(s): HGBA1C in the last 72 hours. CBG: No results for input(s): GLUCAP in the last 168 hours. Lipid Profile: No results for input(s): CHOL, HDL, LDLCALC,  TRIG, CHOLHDL, LDLDIRECT in the last 72 hours. Thyroid Function Tests: No results for input(s): TSH, T4TOTAL, FREET4, T3FREE, THYROIDAB in the last 72 hours. Anemia Panel: Recent Labs    04/30/19 2330  FERRITIN 414*   Urine analysis: No results found for: COLORURINE, APPEARANCEUR, LABSPEC, PHURINE, GLUCOSEU, HGBUR, BILIRUBINUR, KETONESUR, PROTEINUR, UROBILINOGEN, NITRITE, LEUKOCYTESUR Sepsis Labs: @LABRCNTIP (procalcitonin:4,lacticidven:4)  ) Recent Results (from the past 240 hour(s))  MRSA PCR Screening     Status: None   Collection Time: 04/30/19 11:05 PM   Specimen: Nasopharyngeal  Result Value Ref Range Status   MRSA by PCR NEGATIVE NEGATIVE Final    Comment:        The GeneXpert MRSA Assay (FDA approved for NASAL specimens only), is one component of a comprehensive MRSA colonization surveillance program. It is not intended to diagnose MRSA infection nor to guide or monitor treatment for MRSA infections. Performed at [facility], [address] [place], [place] [address]          Radiology Studies: No results found.      Scheduled Meds: . Chlorhexidine Gluconate Cloth  6 each Topical Daily  . dexamethasone (DECADRON) injection  8 mg Intravenous Q24H  . enoxaparin (LOVENOX) injection  1 mg/kg Subcutaneous Q2200  . insulin aspart  0-20 Units Subcutaneous TID WC  . Ipratropium-Albuterol  2 puff Inhalation Q6H   Continuous Infusions: . azithromycin    . cefTRIAXone (ROCEPHIN)  IV       LOS: 1 day   The patient is critically ill with multiple organ systems failure and requires high complexity decision making for assessment and support, frequent evaluation and titration of therapies, application of advanced monitoring technologies and extensive interpretation of multiple databases. Critical Care Time devoted to patient care services described in this note  Time spent: 40 minutes     Lanah Steines, Roselind MessierURTIS J, MD [facility] Pager  [phone]  If 7PM-7AM, please contact night-coverage [email or web address] Password [email or web address] 05/01/2019, 7:26 AM

## 2019-05-01 NOTE — Progress Notes (Signed)
ANTICOAGULATION CONSULT NOTE - Initial Consult  Pharmacy Consult for heparin Indication: R/o VTE treatment  No Known Allergies  Patient Measurements: Height: 5\' 5"  (165.1 cm) IBW/kg (Calculated) : 57  Actual body weight: 217 Heparin Dosing Weight: 114 kg   Vital Signs: Temp: 97.8 F (36.6 C) (09/02 0800) Temp Source: Oral (09/02 0800) BP: 135/76 (09/02 1000) Pulse Rate: 92 (09/02 0800)  Labs: Recent Labs    04/30/19 2330 05/01/19 0855  HGB 13.7 13.4  HCT 43.6 42.8  PLT 176 180  CREATININE 1.46*  --     CrCl cannot be calculated (Unknown ideal weight.).   Medical History: Past Medical History:  Diagnosis Date  . Arthritis   . Dyspnea   . Heart murmur    told she had a heart murmur as a child but unsure now  . Hyperlipidemia   . Hypertension   . Pneumonia due to COVID-19 virus 04/30/2019  . Pre-diabetes     Medications:  Medications Prior to Admission  Medication Sig Dispense Refill Last Dose  . atenolol (TENORMIN) 50 MG tablet Take 50 mg by mouth daily.   04/28/2019 at 0700  . hydrochlorothiazide (HYDRODIURIL) 25 MG tablet Take 25 mg by mouth daily.   04/28/2019 at Unknown time  . ibuprofen (ADVIL) 200 MG tablet Take 200 mg by mouth every 6 (six) hours as needed for moderate pain.   unk  . lovastatin (MEVACOR) 20 MG tablet Take 20 mg by mouth 2 (two) times daily.   04/28/2019 at Unknown time    Assessment: 38 YOF with concern for LE VTE to start on IV heparin. H/H and Plt wnl. D-dimer > 20. SCr 1.46. Of note she received a total of Lovenox 180 mg yesterday evening.   Goal of Therapy:  Heparin level 0.3-0.7 units/ml Monitor platelets by anticoagulation protocol: Yes   Plan:  -Heparin 2500 units IV bolus, then start IV heparin at 1650 units/hr -F/u 6 hr HL -Monitor daily HL, CBC and s/s of bleeding   Albertina Parr, PharmD., BCPS Clinical Pharmacist Clinical phone for 05/01/19 until 5pm: [phone]

## 2019-05-02 LAB — MAGNESIUM: Magnesium: 2.4 mg/dL (ref 1.7–2.4)

## 2019-05-02 LAB — COMPREHENSIVE METABOLIC PANEL
ALT: 37 U/L (ref 0–44)
AST: 33 U/L (ref 15–41)
Albumin: 3.4 g/dL — ABNORMAL LOW (ref 3.5–5.0)
Alkaline Phosphatase: 70 U/L (ref 38–126)
Anion gap: 14 (ref 5–15)
BUN: 41 mg/dL — ABNORMAL HIGH (ref 6–20)
CO2: 25 mmol/L (ref 22–32)
Calcium: 8.8 mg/dL — ABNORMAL LOW (ref 8.9–10.3)
Chloride: 103 mmol/L (ref 98–111)
Creatinine, Ser: 1.22 mg/dL — ABNORMAL HIGH (ref 0.44–1.00)
GFR calc Af Amer: 59 mL/min — ABNORMAL LOW (ref >60)
GFR calc non Af Amer: 51 mL/min — ABNORMAL LOW (ref >60)
Glucose, Bld: 154 mg/dL — ABNORMAL HIGH (ref 70–99)
Potassium: 4.4 mmol/L (ref 3.5–5.1)
Sodium: 142 mmol/L (ref 135–145)
Total Bilirubin: 0.6 mg/dL (ref 0.3–1.2)
Total Protein: 7.6 g/dL (ref 6.5–8.1)

## 2019-05-02 LAB — CBC WITH DIFFERENTIAL/PLATELET
Abs Immature Granulocytes: 0.17 10*3/uL — ABNORMAL HIGH (ref 0.00–0.07)
Basophils Absolute: 0 10*3/uL (ref 0.0–0.1)
Basophils Relative: 0 %
Eosinophils Absolute: 0 10*3/uL (ref 0.0–0.5)
Eosinophils Relative: 0 %
HCT: 42.2 % (ref 36.0–46.0)
Hemoglobin: 13 g/dL (ref 12.0–15.0)
Immature Granulocytes: 1 %
Lymphocytes Relative: 8 %
Lymphs Abs: 1.1 10*3/uL (ref 0.7–4.0)
MCH: 26.6 pg (ref 26.0–34.0)
MCHC: 30.8 g/dL (ref 30.0–36.0)
MCV: 86.3 fL (ref 80.0–100.0)
Monocytes Absolute: 0.6 10*3/uL (ref 0.1–1.0)
Monocytes Relative: 4 %
Neutro Abs: 11.4 10*3/uL — ABNORMAL HIGH (ref 1.7–7.7)
Neutrophils Relative %: 87 %
Platelets: 215 10*3/uL (ref 150–400)
RBC: 4.89 MIL/uL (ref 3.87–5.11)
RDW: 14.6 % (ref 11.5–15.5)
WBC: 13.2 10*3/uL — ABNORMAL HIGH (ref 4.0–10.5)
nRBC: 0.2 % (ref 0.0–0.2)

## 2019-05-02 LAB — D-DIMER, QUANTITATIVE: D-Dimer, Quant: >20 ug/mL-FEU — ABNORMAL HIGH (ref 0.00–0.50)

## 2019-05-02 LAB — PHOSPHORUS: Phosphorus: 3.9 mg/dL (ref 2.5–4.6)

## 2019-05-02 LAB — PROCALCITONIN: Procalcitonin: 0.23 ng/mL

## 2019-05-02 LAB — LIPID PANEL
Cholesterol: 187 mg/dL (ref 0–200)
HDL: 36 mg/dL — ABNORMAL LOW (ref >40)
LDL Cholesterol: 127 mg/dL — ABNORMAL HIGH (ref 0–99)
Total CHOL/HDL Ratio: 5.2 RATIO
Triglycerides: 122 mg/dL (ref <150)
VLDL: 24 mg/dL (ref 0–40)

## 2019-05-02 LAB — GLUCOSE, CAPILLARY
Glucose-Capillary: 161 mg/dL — ABNORMAL HIGH (ref 70–99)
Glucose-Capillary: 155 mg/dL — ABNORMAL HIGH (ref 70–99)
Glucose-Capillary: 287 mg/dL — ABNORMAL HIGH (ref 70–99)
Glucose-Capillary: 215 mg/dL — ABNORMAL HIGH (ref 70–99)
Glucose-Capillary: 173 mg/dL — ABNORMAL HIGH (ref 70–99)

## 2019-05-02 LAB — FERRITIN: Ferritin: 329 ng/mL — ABNORMAL HIGH (ref 11–307)

## 2019-05-02 LAB — C-REACTIVE PROTEIN: CRP: 3.5 mg/dL — ABNORMAL HIGH (ref <1.0)

## 2019-05-02 LAB — HEPARIN LEVEL (UNFRACTIONATED): Heparin Unfractionated: 0.45 IU/mL (ref 0.30–0.70)

## 2019-05-02 MED ORDER — METHYLPREDNISOLONE SODIUM SUCC 125 MG IJ SOLR
60.0000 mg | Freq: Two times a day (BID) | INTRAMUSCULAR | Status: DC
  Administered 2019-05-02: 22:00:00 60 mg via INTRAVENOUS
  Filled 2019-05-02: qty 2

## 2019-05-02 NOTE — Progress Notes (Signed)
PROGRESS NOTE                                                                                                                                                                                                             Patient Demographics:    Jamie Burton, is a 53 y.o. female, DOB - 09/05/1965, SPQ:[id number]  Outpatient Primary MD for the patient is Caryl Bis, MD    LOS - 2  Admit date - 04/30/2019    CC - SOB     Brief Narrative  Jamie Burton a 53 y.o.BF PMHx OA, dyspnea, childhood heart murmur, HTN, HLD,  prediabetes, morbid obesity,    [facility] the past 3 days associated with fatigue, malaise, chills, night sweats, but no fever. She works at a [employer] home and tested positive on 04/26/2027, was diagnosed with COVID-19 pneumonitis with extremely high d-dimer and transferred here to ICU for further care.   Subjective:    Shantrell Placzek today has, No headache, No chest pain, No abdominal pain - No Nausea, No new weakness tingling or numbness, improved Cough & SOB.     Assessment  & Plan :     1. Acute Hypoxic Resp. Failure due to Acute Covid 19 Viral Pneumonitis during the ongoing 2020 Covid 19 Pandemic - she was treated with IV steroids + Remdisvir along with full anticoagulation due to extremely high d-dimer.  Renal function precluded to CT angiogram.  Lower extremity venous duplex was not a high quality study but was negative.  Echocardiogram did not show any RV dysfunction.  At this time this line of treatment will be continued as d-dimer continues to be extremely high, she could have a lower extremity or intra-abdominal clot which could explain her extremely high d-dimer, for now plan will be to complete IV Remdisvir, continue steroids and full anticoagulation.  She will be discharged on oral anticoagulation for at least 3 months with  outpatient repeat ultrasound by PCP and repeat d-dimer checks in the outpatient setting until she stabilizes.  [place]    04/30/19 2330 05/01/19 0155 05/01/19 3335  05/02/19 0633  DDIMER >20.00*  --  >20.00* >20.00*  FERRITIN 414*  --  345* 329*  CRP 11.9* 11.2*  --  3.5*    No results found for: SARSCOV2NAA   Hepatic Function Latest Ref Rng & Units 05/02/2019 04/30/2019  Total Protein 6.5 - 8.1 g/dL 7.6 8.9(H)  Albumin 3.5 - 5.0 g/dL 3.4(L) 3.6  AST 15 - 41 U/L 33 41  ALT 0 - 44 U/L 37 36  Alk Phosphatase 38 - 126 U/L 70 72  Total Bilirubin 0.3 - 1.2 mg/dL 0.6 1.2        Component Value Date/Time   BNP 1,317.4 (H) 04/30/2019 2330      2.  Extremely elevated d-dimer.  See above.  Outpatient hematology follow-up also recommended.  3.  Morbid obesity.  Follow with PCP for weight loss.  4.  Chronic diastolic dysfunction EF 17%.  Currently compensated.  5.  Hypertension.  On beta-blocker.      Condition - Fair  Family Communication  :  None  Code Status :  Full  Diet :   Diet Order            Diet regular Room service appropriate? Yes; Fluid consistency: Thin  Diet effective now               Disposition Plan  :  Home in 3-4 days  Consults  :  PCCM  Procedures  :  Venous US -  Negative  TTE -        1. The left ventricle has hyperdynamic systolic function, with an ejection fraction of >65%. The cavity size was normal. There is moderately increased left ventricular wall thickness. No evidence of left ventricular regional wall motion abnormalities.  2. The right ventricle has normal systolc function. The cavity was normal. There is no increase in right ventricular wall thickness. Right ventricular systolic pressure is normal with an estimated pressure of 36.0 mmHg.  3. The aortic valve was not well visualized.  4. Pulmonic valve regurgitation was not assessed by color flow Doppler.    PUD Prophylaxis : None  DVT Prophylaxis  :     Heparin gtt  Lab Results  Component Value Date   PLT 215 05/02/2019    Inpatient Medications  Scheduled Meds:  Chlorhexidine Gluconate Cloth  6 each Topical Daily   insulin aspart  0-20 Units Subcutaneous TID WC   methylPREDNISolone (SOLU-MEDROL) injection  60 mg Intravenous TID   sodium chloride flush  10-40 mL Intracatheter Q12H   Continuous Infusions:  azithromycin Stopped (05/01/19 1407)   cefTRIAXone (ROCEPHIN)  IV Stopped (05/01/19 1255)   heparin 1,650 Units/hr (05/02/19 0407)   remdesivir 100 mg in NS 250 mL     PRN Meds:.Ipratropium-Albuterol, prochlorperazine, sodium chloride flush  Antibiotics  :    Anti-infectives (From admission, onward)   Start     Dose/Rate Route Frequency Ordered Stop   05/02/19 1000  remdesivir 100 mg in sodium chloride 0.9 % 250 mL IVPB     100 mg 500 mL/hr over 30 Minutes Intravenous Every 24 hours 05/01/19 0817 05/06/19 0959   05/02/19 0900  remdesivir 100 mg in sodium chloride 0.9 % 250 mL IVPB  Status:  Discontinued     100 mg 500 mL/hr over 30 Minutes Intravenous Every 24 hours 05/01/19 0805 05/01/19 0817   05/01/19 1200  cefTRIAXone (ROCEPHIN) 2 g in sodium chloride 0.9 % 100 mL IVPB     2 g 200 mL/hr over 30  Minutes Intravenous Every 24 hours 05/01/19 0452 05/05/19 1159   05/01/19 1200  azithromycin (ZITHROMAX) 500 mg in sodium chloride 0.9 % 250 mL IVPB     500 mg 250 mL/hr over 60 Minutes Intravenous Every 24 hours 05/01/19 0452 05/05/19 1159   05/01/19 1000  remdesivir 200 mg in sodium chloride 0.9 % 250 mL IVPB     200 mg 500 mL/hr over 30 Minutes Intravenous Once 05/01/19 0817 05/01/19 1059   05/01/19 0900  remdesivir 200 mg in sodium chloride 0.9 % 250 mL IVPB  Status:  Discontinued     200 mg 500 mL/hr over 30 Minutes Intravenous Once 05/01/19 0805 05/01/19 0817       Time Spent in minutes  30   Lala Lund M.D on 05/02/2019 at 10:13 AM  To page go to [email or web address] - password [email or web address]  [facility] -   Office  [phone]    See all Orders from today for further details    Objective:   Vitals:   05/02/19 0300 05/02/19 0400 05/02/19 0550 05/02/19 0724  BP: 116/76 99/70 120/82 114/70  Pulse:   65 64  Resp: (!) 26 (!) 26 (!) 26 (!) 27  Temp:  97.7 F (36.5 C) 98 F (36.7 C) 97.6 F (36.4 C)  TempSrc:  Oral Oral Oral  SpO2: 92% 93% 92% 93%  Weight:      Height:        Wt Readings from Last 3 Encounters:  05/01/19 (!) 217.7 kg     Intake/Output Summary (Last 24 hours) at 05/02/2019 1013 Last data filed at 05/02/2019 0550 Gross per 24 hour  Intake 961.03 ml  Output 1120 ml  Net -158.97 ml     Physical Exam  Awake Alert, Oriented X 3, No new F.N deficits, Normal affect [place].AT,PERRAL Supple Neck,No JVD, No cervical lymphadenopathy appriciated.  Symmetrical Chest wall movement, Good air movement bilaterally, CTAB RRR,No Gallops,Rubs or new Murmurs, No Parasternal Heave +ve B.Sounds, Abd Soft, No tenderness, No organomegaly appriciated, No rebound - guarding or rigidity. No Cyanosis, Clubbing or edema, No new Rash or bruise       Data Review:    CBC Recent Labs  Lab 04/30/19 2330 05/01/19 0855 05/02/19 0633  WBC 6.9 11.2* 13.2*  HGB 13.7 13.4 13.0  HCT 43.6 42.8 42.2  PLT 176 180 215  MCV 84.8 85.8 86.3  MCH 26.7 26.9 26.6  MCHC 31.4 31.3 30.8  RDW 14.6 14.6 14.6  LYMPHSABS 0.8 1.1 1.1  MONOABS 0.5 1.1* 0.6  EOSABS 0.0 0.0 0.0  BASOSABS 0.0 0.0 0.0    Chemistries  Recent Labs  Lab 04/30/19 2330 05/01/19 0855 05/02/19 0633  NA 138  --  142  K 4.4  --  4.4  CL 102  --  103  CO2 22  --  25  GLUCOSE 180*  --  154*  BUN 32*  --  41*  CREATININE 1.46*  --  1.22*  CALCIUM 8.7*  --  8.8*  MG 2.6* 2.6* 2.4  AST 41  --  33  ALT 36  --  37  ALKPHOS 72  --  70  BILITOT 1.2  --  0.6   ------------------------------------------------------------------------------------------------------------------ Recent Labs    05/02/19 0633  CHOL 187  HDL 36*    LDLCALC 127*  TRIG 122  CHOLHDL 5.2    Lab Results  Component Value Date   HGBA1C 6.6 (H) 05/01/2019   ------------------------------------------------------------------------------------------------------------------ No results for input(s): TSH, T4TOTAL, T3FREE, THYROIDAB  in the last 72 hours.  Invalid input(s): FREET3  Cardiac Enzymes No results for input(s): CKMB, TROPONINI, MYOGLOBIN in the last 168 hours.  Invalid input(s): CK ------------------------------------------------------------------------------------------------------------------    Component Value Date/Time   BNP 1,317.4 (H) 04/30/2019 2330    Micro Results Recent Results (from the past 240 hour(s))  MRSA PCR Screening     Status: None   Collection Time: 04/30/19 11:05 PM   Specimen: Nasopharyngeal  Result Value Ref Range Status   MRSA by PCR NEGATIVE NEGATIVE Final    Comment:        The GeneXpert MRSA Assay (FDA approved for NASAL specimens only), is one component of a comprehensive MRSA colonization surveillance program. It is not intended to diagnose MRSA infection nor to guide or monitor treatment for MRSA infections. Performed at [facility], [place] [address] [place],  [address]     Radiology Reports Dg Chest Port 1 View  Result Date: 05/01/2019 CLINICAL DATA:  Pneumonia. EXAM: PORTABLE CHEST 1 VIEW COMPARISON:  One-view chest x-ray 04/30/2019 at [facility]. FINDINGS: Heart is enlarged. Lung volumes are low. Progressive bibasilar airspace opacities are present. There is no edema or effusion. IMPRESSION: 1. Slight progression of bibasilar airspace opacities compatible with COVID-19 pneumonia. 2. Cardiomegaly without failure. 3. Low lung volumes. Electronically Signed   By: San Morelle M.D.   On: 05/01/2019 08:54   Vas [place] Lower Extremity Venous (dvt)  Result Date: 05/01/2019  Lower Venous Study Indications: Swelling, and elevated dimer. Other Indications:  COVID 19. Anticoagulation: Lovenox. Comparison Study: Patient had a Lower extremity venous performed at another                   facility on 03/30/19 the results were inconclusive. The exam                   performed today only the Common Femoral proximal and mid were                   adequately visualized with no evidence of DVT in these areas.                   Very limited exam due to patient habitus Performing Technologist: Darlina Sicilian RDCS  Examination Guidelines: A complete evaluation includes B-mode imaging, spectral Doppler, color Doppler, and power Doppler as needed of all accessible portions of each vessel. Bilateral testing is considered an integral part of a complete examination. Limited examinations for reoccurring indications may be performed as noted.  +---------+---------------+---------+-----------+----------+--------------+  RIGHT     Compressibility Phasicity Spontaneity Properties Thrombus Aging  +---------+---------------+---------+-----------+----------+--------------+  CFV       Full                                                             +---------+---------------+---------+-----------+----------+--------------+  SFJ       Full                                                             +---------+---------------+---------+-----------+----------+--------------+  FV Prox   Full                                                             +---------+---------------+---------+-----------+----------+--------------+  FV Mid    Full                                                             +---------+---------------+---------+-----------+----------+--------------+  FV Distal                                                  Not visualized  +---------+---------------+---------+-----------+----------+--------------+  POP                                                        Not visualized  +---------+---------------+---------+-----------+----------+--------------+  PTV       Full                                                              +---------+---------------+---------+-----------+----------+--------------+  Soleal                                                     Not visualized  +---------+---------------+---------+-----------+----------+--------------+   +---------+---------------+---------+-----------+----------+-------------------+  LEFT      Compressibility Phasicity Spontaneity Properties Thrombus Aging       +---------+---------------+---------+-----------+----------+-------------------+  CFV       Full            Yes       Yes                                         +---------+---------------+---------+-----------+----------+-------------------+  SFJ       Full                                                                  +---------+---------------+---------+-----------+----------+-------------------+  FV Prox   Full                                                                  +---------+---------------+---------+-----------+----------+-------------------+  FV Mid    Full                                                                  +---------+---------------+---------+-----------+----------+-------------------+  FV Distal                                                  able to visualize                                                                in long axis with                                                                color flow due to                                                                the depth of the                                                                 vein unable to                                                                   compress             +---------+---------------+---------+-----------+----------+-------------------+  PTV       Full                                                                  +---------+---------------+---------+-----------+----------+-------------------+  PERO                                                        Not visualized       +---------+---------------+---------+-----------+----------+-------------------+    Summary: Right: Portions of this examination were limited- see technologist comments above. There is no evidence of deep vein thrombosis in the lower extremity. Left: Portions of this examination were limited- see technologist comments above. There is no evidence of deep vein thrombosis in the lower extremity.  *See table(s) above for measurements and  observations. Electronically signed by Curt Jews MD on 05/01/2019 at 4:38:48 PM.    Final

## 2019-05-02 NOTE — Progress Notes (Signed)
Patient arrived to PCU rm 9134 around 0510. On 6L [place] satting low 90s. HR and BP WNL. No complaints of CP, SOB, abd pain, N/V at this time. Education on flutter valve and IS given to the patient. Mother called and updated on patient's transfer.

## 2019-05-02 NOTE — Progress Notes (Signed)
ANTICOAGULATION CONSULT NOTE - Follow Up Consult  Pharmacy Consult for heparin Indication: R/o VTE treatment  No Known Allergies  Patient Measurements: Height: 5\' 5"  (165.1 cm) Weight: (!) 480 lb (217.7 kg) IBW/kg (Calculated) : 57  Actual body weight: 217 Heparin Dosing Weight: 114 kg   Vital Signs: Temp: 97.6 F (36.4 C) (09/03 0724) Temp Source: Oral (09/03 0724) BP: 114/70 (09/03 0724) Pulse Rate: 64 (09/03 0724)  Labs: Recent Labs    04/30/19 2330 05/01/19 0855 05/01/19 2040 05/02/19 0633  HGB 13.7 13.4  --  13.0  HCT 43.6 42.8  --  42.2  PLT 176 180  --  215  HEPARINUNFRC  --   --  0.44 0.45  CREATININE 1.46*  --   --  1.22*    Estimated Creatinine Clearance: 104.5 mL/min (A) (by C-G formula based on SCr of 1.22 mg/dL (H)).   Medical History: Past Medical History:  Diagnosis Date  . Arthritis   . Dyspnea   . Heart murmur    told she had a heart murmur as a child but unsure now  . Hyperlipidemia   . Hypertension   . Pneumonia due to COVID-19 virus 04/30/2019  . Pre-diabetes     Medications:  Medications Prior to Admission  Medication Sig Dispense Refill Last Dose  . atenolol (TENORMIN) 50 MG tablet Take 50 mg by mouth daily.   04/28/2019 at 0700  . hydrochlorothiazide (HYDRODIURIL) 25 MG tablet Take 25 mg by mouth daily.   04/28/2019 at Unknown time  . ibuprofen (ADVIL) 200 MG tablet Take 200 mg by mouth every 6 (six) hours as needed for moderate pain.   unk  . lovastatin (MEVACOR) 20 MG tablet Take 20 mg by mouth 2 (two) times daily.   04/28/2019 at Unknown time    Assessment: 78 YOF with concern for LE VTE on IV heparin. LE venous duplex was inconclusive.  H/H and Plt wnl. D-dimer > 20. SCr 1.46>1.26. HL this AM remains therapeutic at 0.45 on 1650 units/hr  Goal of Therapy:  Heparin level 0.3-0.7 units/ml Monitor platelets by anticoagulation protocol: Yes   Plan:   Continue  IV heparin at 1650 units/hr  Monitor daily HL, CBC and s/s of  bleeding   Planning to discharge on oral anticoagulant per MD    Albertina Parr, PharmD., BCPS Clinical Pharmacist Clinical phone for 05/02/19 until 5pm: [phone]

## 2019-05-02 NOTE — Progress Notes (Signed)
Patient's mother was called and updated about plan of care and patient status. Patient was requesting to have clothes brought to the hospital for when she is discharged. Patient's mother was instructed on policy and when to drop of clothes. All questions answered.

## 2019-05-03 LAB — CBC WITH DIFFERENTIAL/PLATELET
Abs Immature Granulocytes: 0.35 10*3/uL — ABNORMAL HIGH (ref 0.00–0.07)
Basophils Absolute: 0 10*3/uL (ref 0.0–0.1)
Basophils Relative: 0 %
Eosinophils Absolute: 0 10*3/uL (ref 0.0–0.5)
Eosinophils Relative: 0 %
HCT: 40.8 % (ref 36.0–46.0)
Hemoglobin: 12.2 g/dL (ref 12.0–15.0)
Immature Granulocytes: 2 %
Lymphocytes Relative: 7 %
Lymphs Abs: 1 10*3/uL (ref 0.7–4.0)
MCH: 26.1 pg (ref 26.0–34.0)
MCHC: 29.9 g/dL — ABNORMAL LOW (ref 30.0–36.0)
MCV: 87.4 fL (ref 80.0–100.0)
Monocytes Absolute: 0.9 10*3/uL (ref 0.1–1.0)
Monocytes Relative: 6 %
Neutro Abs: 12.2 10*3/uL — ABNORMAL HIGH (ref 1.7–7.7)
Neutrophils Relative %: 85 %
Platelets: 185 10*3/uL (ref 150–400)
RBC: 4.67 MIL/uL (ref 3.87–5.11)
RDW: 14.7 % (ref 11.5–15.5)
WBC: 14.4 10*3/uL — ABNORMAL HIGH (ref 4.0–10.5)
nRBC: 0.1 % (ref 0.0–0.2)

## 2019-05-03 LAB — FERRITIN: Ferritin: 246 ng/mL (ref 11–307)

## 2019-05-03 LAB — COMPREHENSIVE METABOLIC PANEL
ALT: 32 U/L (ref 0–44)
AST: 22 U/L (ref 15–41)
Albumin: 3.2 g/dL — ABNORMAL LOW (ref 3.5–5.0)
Alkaline Phosphatase: 61 U/L (ref 38–126)
Anion gap: 12 (ref 5–15)
BUN: 35 mg/dL — ABNORMAL HIGH (ref 6–20)
CO2: 25 mmol/L (ref 22–32)
Calcium: 8.2 mg/dL — ABNORMAL LOW (ref 8.9–10.3)
Chloride: 104 mmol/L (ref 98–111)
Creatinine, Ser: 1.01 mg/dL — ABNORMAL HIGH (ref 0.44–1.00)
GFR calc Af Amer: >60 mL/min (ref >60)
GFR calc non Af Amer: >60 mL/min (ref >60)
Glucose, Bld: 181 mg/dL — ABNORMAL HIGH (ref 70–99)
Potassium: 4.4 mmol/L (ref 3.5–5.1)
Sodium: 141 mmol/L (ref 135–145)
Total Bilirubin: 0.4 mg/dL (ref 0.3–1.2)
Total Protein: 7.1 g/dL (ref 6.5–8.1)

## 2019-05-03 LAB — MAGNESIUM: Magnesium: 2.4 mg/dL (ref 1.7–2.4)

## 2019-05-03 LAB — PROCALCITONIN: Procalcitonin: 0.12 ng/mL

## 2019-05-03 LAB — HEPARIN LEVEL (UNFRACTIONATED): Heparin Unfractionated: 0.45 IU/mL (ref 0.30–0.70)

## 2019-05-03 LAB — GLUCOSE, CAPILLARY
Glucose-Capillary: 170 mg/dL — ABNORMAL HIGH (ref 70–99)
Glucose-Capillary: 176 mg/dL — ABNORMAL HIGH (ref 70–99)
Glucose-Capillary: 191 mg/dL — ABNORMAL HIGH (ref 70–99)
Glucose-Capillary: 173 mg/dL — ABNORMAL HIGH (ref 70–99)

## 2019-05-03 LAB — LACTATE DEHYDROGENASE: LDH: 375 U/L — ABNORMAL HIGH (ref 98–192)

## 2019-05-03 LAB — C-REACTIVE PROTEIN: CRP: 1.5 mg/dL — ABNORMAL HIGH (ref <1.0)

## 2019-05-03 LAB — BRAIN NATRIURETIC PEPTIDE: B Natriuretic Peptide: 484.7 pg/mL — ABNORMAL HIGH (ref 0.0–100.0)

## 2019-05-03 LAB — D-DIMER, QUANTITATIVE: D-Dimer, Quant: 19.29 ug/mL-FEU — ABNORMAL HIGH (ref 0.00–0.50)

## 2019-05-03 MED ORDER — AMLODIPINE BESYLATE 5 MG PO TABS
10.0000 mg | ORAL_TABLET | Freq: Every day | ORAL | Status: DC
  Administered 2019-05-03 – 2019-05-05 (×3): 10 mg via ORAL
  Filled 2019-05-03 (×4): qty 2

## 2019-05-03 MED ORDER — METHYLPREDNISOLONE SODIUM SUCC 40 MG IJ SOLR
40.0000 mg | Freq: Two times a day (BID) | INTRAMUSCULAR | Status: DC
  Administered 2019-05-03 – 2019-05-05 (×5): 40 mg via INTRAVENOUS
  Filled 2019-05-03 (×5): qty 1

## 2019-05-03 NOTE — Progress Notes (Signed)
ANTICOAGULATION CONSULT NOTE - Follow Up Consult  Pharmacy Consult for heparin Indication: R/o VTE treatment  No Known Allergies  Patient Measurements: Height: 5\' 5"  (165.1 cm) Weight: (!) 480 lb (217.7 kg) IBW/kg (Calculated) : 57  Actual body weight: 217 Heparin Dosing Weight: 114 kg   Vital Signs: Temp: 97.3 F (36.3 C) (09/04 0417) Temp Source: Oral (09/04 0417) BP: 130/78 (09/04 0417) Pulse Rate: 88 (09/04 0417)  Labs: Recent Labs    04/30/19 2330 05/01/19 0855 05/01/19 2040 05/02/19 0633 05/03/19 0302  HGB 13.7 13.4  --  13.0 12.2  HCT 43.6 42.8  --  42.2 40.8  PLT 176 180  --  215 185  HEPARINUNFRC  --   --  0.44 0.45 0.45  CREATININE 1.46*  --   --  1.22* 1.01*    Estimated Creatinine Clearance: 126.2 mL/min (A) (by C-G formula based on SCr of 1.01 mg/dL (H)).   Medical History: Past Medical History:  Diagnosis Date  . Arthritis   . Dyspnea   . Heart murmur    told she had a heart murmur as a child but unsure now  . Hyperlipidemia   . Hypertension   . Pneumonia due to COVID-19 virus 04/30/2019  . Pre-diabetes     Medications:  Medications Prior to Admission  Medication Sig Dispense Refill Last Dose  . atenolol (TENORMIN) 50 MG tablet Take 50 mg by mouth daily.   04/28/2019 at 0700  . hydrochlorothiazide (HYDRODIURIL) 25 MG tablet Take 25 mg by mouth daily.   04/28/2019 at Unknown time  . ibuprofen (ADVIL) 200 MG tablet Take 200 mg by mouth every 6 (six) hours as needed for moderate pain.   unk  . lovastatin (MEVACOR) 20 MG tablet Take 20 mg by mouth 2 (two) times daily.   04/28/2019 at Unknown time    Assessment: 26 YOF with concern for LE VTE on IV heparin. LE venous duplex was inconclusive.    Today, 05/03/19   HL is 0.45, therapeutic   D-dimer 19.29  SCr improving now 1.01   Hgb 12.2, plt 185, WNL   No line or bleeding issues per RN    Goal of Therapy:  Heparin level 0.3-0.7 units/ml Monitor platelets by anticoagulation protocol:  Yes   Plan:   Continue  IV heparin at 1650 units/hr  Monitor daily HL, CBC and s/s of bleeding   Planning to discharge on oral anticoagulant per MD     Royetta Asal, PharmD, BCPS 05/03/2019 5:39 AM

## 2019-05-03 NOTE — Progress Notes (Signed)
SATURATION QUALIFICATIONS: (This note is used to comply with regulatory documentation for home oxygen)  Patient Saturations on Room Air at Rest = 84%  Patient Saturations on Room Air while Ambulating = NT-- pt was already 84% on RA at rest  Patient Saturations on 6 Liters of oxygen while Ambulating = 85%  Please briefly explain why patient needs home oxygen: Pt desaturates on RA at rest.  Wells Guiles B. Ellizabeth Dacruz, PT, DPT  Acute Rehabilitation [phone] pager [phone]) [phone] office  @ [facility]: [phone]

## 2019-05-03 NOTE — Plan of Care (Signed)
Patient sat up in chair all day and transfers from the bed to the commode with mild desaturation to the mid 80s. Patient reports using flutter valve and IS regularly but requires reinforcement. Patient asked about being discharged after the remdesivir is complete. This RN informed the patient that her medical status must be stable prior to discharge as well.  Problem: Education: Goal: Knowledge of risk factors and measures for prevention of condition will improve Outcome: Progressing   Problem: Coping: Goal: Psychosocial and spiritual needs will be supported Outcome: Progressing   Problem: Respiratory: Goal: Will maintain a patent airway Outcome: Progressing Goal: Complications related to the disease process, condition or treatment will be avoided or minimized Outcome: Progressing   Problem: Education: Goal: Knowledge of General Education information will improve Description: Including pain rating scale, medication(s)/side effects and non-pharmacologic comfort measures Outcome: Progressing   Problem: Health Behavior/Discharge Planning: Goal: Ability to manage health-related needs will improve Outcome: Progressing   Problem: Clinical Measurements: Goal: Ability to maintain clinical measurements within normal limits will improve Outcome: Progressing Goal: Will remain free from infection Outcome: Progressing Goal: Diagnostic test results will improve Outcome: Progressing Goal: Respiratory complications will improve Outcome: Progressing Goal: Cardiovascular complication will be avoided Outcome: Progressing   Problem: Activity: Goal: Risk for activity intolerance will decrease Outcome: Progressing   Problem: Nutrition: Goal: Adequate nutrition will be maintained Outcome: Progressing   Problem: Coping: Goal: Level of anxiety will decrease Outcome: Progressing   Problem: Elimination: Goal: Will not experience complications related to bowel motility Outcome:  Progressing Goal: Will not experience complications related to urinary retention Outcome: Progressing   Problem: Pain Managment: Goal: General experience of comfort will improve Outcome: Progressing   Problem: Safety: Goal: Ability to remain free from injury will improve Outcome: Progressing   Problem: Skin Integrity: Goal: Risk for impaired skin integrity will decrease Outcome: Progressing

## 2019-05-03 NOTE — Evaluation (Signed)
Physical Therapy Evaluation Patient Details Name: Jamie ManeSharon Pearce MRN: [id number] DOB: Aug 15, 1966 Today's Date: 05/03/2019   History of Present Illness  53 y.o. female admitted on 04/30/19 for SOB due to COVID 19 PNA.  Pt also with significantly elevated d-dimer, negative DVT, but suspicious due to high d-dimer, so placed on heparin drip.  Pt with significant PMH of pre diabetes, HTN, arthritis.    Clinical Impression  Pt desaturates to 85% on 6 L O2 [place] during gait.  DOE 3/4, 3-4 mins of recovery as measured by nellcor finger probe.  Pt reports compliance with IS and flutter valve and would benefit from OT consult.  PT will continue to follow acutely for safe mobility progression    Follow Up Recommendations No PT follow up    Equipment Recommendations  Other (comment)(home O2, shower chair)    Recommendations for Other Services   OT consult    Precautions / Restrictions Precautions Precautions: Other (comment) Precaution Comments: monitor O2      Mobility  Bed Mobility               General bed mobility comments: Pt was OOB in the recliner chair.   Transfers Overall transfer level: Needs assistance Equipment used: Rolling walker (2 wheeled) Transfers: Sit to/from Stand Sit to Stand: Supervision         General transfer comment: supervision for safety  Ambulation/Gait Ambulation/Gait assistance: Min guard Gait Distance (Feet): 15 Feet(x3) Assistive device: None Gait Pattern/deviations: Step-through pattern;Staggering left;Staggering right Gait velocity: decreased   General Gait Details: Pt with mildly staggering gait pattern.  O2 sats decrease to 85% on 6 L O2 [place] with gait taking 3-4 mins to recover back to 90 and decreased DOE.          Balance Overall balance assessment: Mild deficits observed, not formally tested                                           Pertinent Vitals/Pain Pain Assessment: No/denies pain    Home Living  Family/patient expects to be discharged to:: Private residence Living Arrangements: Alone Available Help at Discharge: Family;Available PRN/intermittently Type of Home: House Home Access: Level entry     Home Layout: One level Home Equipment: None      Prior Function Level of Independence: Independent         Comments: Pt works in [employer], [employer] at [employer]     Hand Dominance   Dominant Hand: Right    Extremity/Trunk Assessment   Upper Extremity Assessment Upper Extremity Assessment: Generalized weakness    Lower Extremity Assessment Lower Extremity Assessment: Generalized weakness    Cervical / Trunk Assessment Cervical / Trunk Assessment: Normal  Communication   Communication: No difficulties  Cognition Arousal/Alertness: Awake/alert Behavior During Therapy: WFL for tasks assessed/performed Overall Cognitive Status: Within Functional Limits for tasks assessed                                               Assessment/Plan    PT Assessment Patient needs continued PT services  PT Problem List Decreased strength;Decreased activity tolerance;Decreased balance;Decreased mobility;Decreased knowledge of precautions;Cardiopulmonary status limiting activity;Obesity       PT Treatment Interventions DME instruction;Stair training;Gait training;Functional mobility training;Therapeutic activities;Therapeutic exercise;Balance training;Patient/family education  PT Goals (Current goals can be found in the Care Plan section)  Acute Rehab PT Goals Patient Stated Goal: to be able to go home by Monday PT Goal Formulation: With patient Time For Goal Achievement: 05/17/19 Potential to Achieve Goals: Good    Frequency Min 3X/week           AM-PAC PT "6 Clicks" Mobility  Outcome Measure Help needed turning from your back to your side while in a flat bed without using bedrails?: A Little Help needed moving from lying on your back to sitting on the  side of a flat bed without using bedrails?: A Little Help needed moving to and from a bed to a chair (including a wheelchair)?: A Little Help needed standing up from a chair using your arms (e.g., wheelchair or bedside chair)?: None Help needed to walk in hospital room?: A Little Help needed climbing 3-5 steps with a railing? : A Little 6 Click Score: 19    End of Session Equipment Utilized During Treatment: Oxygen(6L O2 [place]) Activity Tolerance: Patient limited by fatigue Patient left: in chair;with call bell/phone within reach   PT Visit Diagnosis: Muscle weakness (generalized) (M62.81);Difficulty in walking, not elsewhere classified (R26.2)    Time: 2094-7096 PT Time Calculation (min) (ACUTE ONLY): 35 min   Charges:       Wells Guiles B. Suzie Vandam, PT, DPT  Acute Rehabilitation [phone] pager [phone]) [phone] office  @ [facility]: [phone]   PT Evaluation $PT Eval Moderate Complexity: 1 Mod PT Treatments $Gait Training: 8-22 mins        05/03/2019, 4:08 PM

## 2019-05-03 NOTE — Progress Notes (Signed)
PROGRESS NOTE                                                                                                                                                                                                             Patient Demographics:    Jamie Burton, is a 53 y.o. female, DOB - 05-31-1966, BMW:[id number]  Outpatient Primary MD for the patient is Caryl Bis, MD    LOS - 3  Admit date - 04/30/2019    CC - SOB     Brief Narrative  Jamie Burton a 53 y.o.BF PMHx OA, dyspnea, childhood heart murmur, HTN, HLD,  prediabetes, morbid obesity,    [facility] the [facility] 3 days associated with fatigue, malaise, chills, night sweats, but no fever. She works at a [employer] home and tested positive on 04/26/2027, was diagnosed with COVID-19 pneumonitis with extremely high d-dimer and transferred here to ICU for further care.   Subjective:   Patient in bed, appears comfortable, denies any headache, no fever, no chest pain or pressure, no shortness of breath , no abdominal pain. No focal weakness.   Assessment  & Plan :     1. Acute Hypoxic Resp. Failure due to Acute Covid 19 Viral Pneumonitis during the ongoing 2020 Covid 19 Pandemic - she was treated with IV steroids + Remdisvir along with full anticoagulation due to extremely high d-dimer.  Renal function precluded to CT angiogram.  Lower extremity venous duplex was not a high quality study but was negative.  Echocardiogram did not show any RV dysfunction.  At this time this line of treatment will be continued as d-dimer continues to be extremely high, she could have a lower extremity or intra-abdominal clot which could explain her extremely high d-dimer, for now plan will be to complete IV Remdisvir, continue steroids and full anticoagulation.  She will be discharged on oral anticoagulation for at least 3-6  months with outpatient repeat ultrasound by PCP and repeat d-dimer checks in the outpatient setting until she stabilizes.  COVID-19 Labs  Recent Labs    05/01/19 0155 05/01/19 0855 05/02/19 0633 05/03/19 0302  DDIMER  --  >20.00* >20.00* 19.29*  FERRITIN  --  345* 329* 246  LDH  --   --   --  375*  CRP 11.2*  --  3.5* 1.5*    No results found for: SARSCOV2NAA   Hepatic Function Latest Ref Rng & Units 05/03/2019 05/02/2019 04/30/2019  Total Protein 6.5 - 8.1 g/dL 7.1 7.6 8.9(H)  Albumin 3.5 - 5.0 g/dL 3.2(L) 3.4(L) 3.6  AST 15 - 41 U/L 22 33 41  ALT 0 - 44 U/L 32 37 36  Alk Phosphatase 38 - 126 U/L 61 70 72  Total Bilirubin 0.3 - 1.2 mg/dL 0.4 0.6 1.2        Component Value Date/Time   BNP 484.7 (H) 05/03/2019 0302      2.  Extremely elevated d-dimer.  See above.  Outpatient hematology follow-up also recommended.  3.  Morbid obesity.  Follow with PCP for weight loss.  4.  Chronic diastolic dysfunction EF 86%.  Currently compensated.  5.  Hypertension.  Poor control with beta-blocker will add Norvasc for better control.    Condition - Fair  Family Communication  :  None  Code Status :  Full  Diet :   Diet Order            Diet regular Room service appropriate? Yes; Fluid consistency: Thin  Diet effective now               Disposition Plan  :  Home in 3-4 days  Consults  :  PCCM  Procedures  :  Venous US -  Negative  TTE -        1. The left ventricle has hyperdynamic systolic function, with an ejection fraction of >65%. The cavity size was normal. There is moderately increased left ventricular wall thickness. No evidence of left ventricular regional wall motion abnormalities.  2. The right ventricle has normal systolc function. The cavity was normal. There is no increase in right ventricular wall thickness. Right ventricular systolic pressure is normal with an estimated pressure of 36.0 mmHg.  3. The aortic valve was not well visualized.  4.  Pulmonic valve regurgitation was not assessed by color flow Doppler.    PUD Prophylaxis : None  DVT Prophylaxis  :    Heparin gtt  Lab Results  Component Value Date   PLT 185 05/03/2019    Inpatient Medications  Scheduled Meds:  Chlorhexidine Gluconate Cloth  6 each Topical Daily   insulin aspart  0-20 Units Subcutaneous TID WC   methylPREDNISolone (SOLU-MEDROL) injection  40 mg Intravenous Q12H   Continuous Infusions:  heparin 1,650 Units/hr (05/02/19 2021)   remdesivir 100 mg in NS 250 mL 100 mg (05/03/19 0838)   PRN Meds:.Ipratropium-Albuterol, prochlorperazine, sodium chloride flush  Antibiotics  :    Anti-infectives (From admission, onward)   Start     Dose/Rate Route Frequency Ordered Stop   05/02/19 1000  remdesivir 100 mg in sodium chloride 0.9 % 250 mL IVPB     100 mg 500 mL/hr over 30 Minutes Intravenous Every 24 hours 05/01/19 0817 05/06/19 0959   05/02/19 0900  remdesivir 100 mg in sodium chloride 0.9 % 250 mL IVPB  Status:  Discontinued     100 mg 500 mL/hr over 30 Minutes Intravenous Every 24 hours 05/01/19 0805 05/01/19 0817   05/01/19 1200  cefTRIAXone (ROCEPHIN) 2 g in sodium chloride 0.9 % 100 mL IVPB  Status:  Discontinued     2 g 200 mL/hr over 30 Minutes Intravenous Every 24 hours 05/01/19 0452 05/03/19 0746   05/01/19 1200  azithromycin (ZITHROMAX) 500 mg in sodium chloride 0.9 % 250 mL IVPB  Status:  Discontinued  500 mg 250 mL/hr over 60 Minutes Intravenous Every 24 hours 05/01/19 0452 05/03/19 0746   05/01/19 1000  remdesivir 200 mg in sodium chloride 0.9 % 250 mL IVPB     200 mg 500 mL/hr over 30 Minutes Intravenous Once 05/01/19 0817 05/01/19 1059   05/01/19 0900  remdesivir 200 mg in sodium chloride 0.9 % 250 mL IVPB  Status:  Discontinued     200 mg 500 mL/hr over 30 Minutes Intravenous Once 05/01/19 0805 05/01/19 0817       Time Spent in minutes  30   Lala Lund M.D on 05/03/2019 at 10:20 AM  To page go to [email or web address] -  password [email or web address]  [facility] -  Office  [phone]    See all Orders from today for further details    Objective:   Vitals:   05/02/19 1951 05/03/19 0012 05/03/19 0417 05/03/19 0800  BP: (!) 128/51 126/73 130/78 (!) 149/104  Pulse: 86 90 88 78  Resp: (!) 24 (!) 30 (!) 23 (!) 26  Temp: (!) 97.4 F (36.3 C) 98.1 F (36.7 C) (!) 97.3 F (36.3 C) 97.9 F (36.6 C)  TempSrc: Oral Axillary Oral Oral  SpO2: 95% 96% 94% 96%  Weight:      Height:        Wt Readings from Last 3 Encounters:  05/01/19 (!) 217.7 kg     Intake/Output Summary (Last 24 hours) at 05/03/2019 1020 Last data filed at 05/03/2019 0800 Gross per 24 hour  Intake 358.2 ml  Output 900 ml  Net -541.8 ml     Physical Exam  Awake Alert, Oriented X 3, No new F.N deficits, Normal affect [place].AT,PERRAL Supple Neck,No JVD, No cervical lymphadenopathy appriciated.  Symmetrical Chest wall movement, Good air movement bilaterally, CTAB RRR,No Gallops, Rubs or new Murmurs, No Parasternal Heave +ve B.Sounds, Abd Soft, No tenderness, No organomegaly appriciated, No rebound - guarding or rigidity. No Cyanosis, Clubbing or edema, No new Rash or bruise    Data Review:    CBC Recent Labs  Lab 04/30/19 2330 05/01/19 0855 05/02/19 0633 05/03/19 0302  WBC 6.9 11.2* 13.2* 14.4*  HGB 13.7 13.4 13.0 12.2  HCT 43.6 42.8 42.2 40.8  PLT 176 180 215 185  MCV 84.8 85.8 86.3 87.4  MCH 26.7 26.9 26.6 26.1  MCHC 31.4 31.3 30.8 29.9*  RDW 14.6 14.6 14.6 14.7  LYMPHSABS 0.8 1.1 1.1 1.0  MONOABS 0.5 1.1* 0.6 0.9  EOSABS 0.0 0.0 0.0 0.0  BASOSABS 0.0 0.0 0.0 0.0    Chemistries  Recent Labs  Lab 04/30/19 2330 05/01/19 0855 05/02/19 0633 05/03/19 0302  NA 138  --  142 141  K 4.4  --  4.4 4.4  CL 102  --  103 104  CO2 22  --  25 25  GLUCOSE 180*  --  154* 181*  BUN 32*  --  41* 35*  CREATININE 1.46*  --  1.22* 1.01*  CALCIUM 8.7*  --  8.8* 8.2*  MG 2.6* 2.6* 2.4 2.4  AST 41  --  33 22  ALT 36  --  37 32    ALKPHOS 72  --  70 61  BILITOT 1.2  --  0.6 0.4   ------------------------------------------------------------------------------------------------------------------ Recent Labs    05/02/19 0633  CHOL 187  HDL 36*  LDLCALC 127*  TRIG 122  CHOLHDL 5.2    Lab Results  Component Value Date   HGBA1C 6.6 (H) 05/01/2019   ------------------------------------------------------------------------------------------------------------------ No results for input(s):  TSH, T4TOTAL, T3FREE, THYROIDAB in the last 72 hours.  Invalid input(s): FREET3  Cardiac Enzymes No results for input(s): CKMB, TROPONINI, MYOGLOBIN in the last 168 hours.  Invalid input(s): CK ------------------------------------------------------------------------------------------------------------------    Component Value Date/Time   BNP 484.7 (H) 05/03/2019 0302    Micro Results Recent Results (from the past 240 hour(s))  MRSA PCR Screening     Status: None   Collection Time: 04/30/19 11:05 PM   Specimen: Nasopharyngeal  Result Value Ref Range Status   MRSA by PCR NEGATIVE NEGATIVE Final    Comment:        The GeneXpert MRSA Assay (FDA approved for NASAL specimens only), is one component of a comprehensive MRSA colonization surveillance program. It is not intended to diagnose MRSA infection nor to guide or monitor treatment for MRSA infections. Performed at [facility], [place] [address], [place], [place] [address]     Radiology Reports Dg Chest Port 1 View  Result Date: 05/01/2019 CLINICAL DATA:  Pneumonia. EXAM: PORTABLE CHEST 1 VIEW COMPARISON:  One-view chest x-ray 04/30/2019 at [facility]. FINDINGS: Heart is enlarged. Lung volumes are low. Progressive bibasilar airspace opacities are present. There is no edema or effusion. IMPRESSION: 1. Slight progression of bibasilar airspace opacities compatible with COVID-19 pneumonia. 2. Cardiomegaly without failure. 3. Low lung volumes.  Electronically Signed   By: San Morelle M.D.   On: 05/01/2019 08:54   Vas [place] Lower Extremity Venous (dvt)  Result Date: 05/01/2019  Lower Venous Study Indications: Swelling, and elevated dimer. Other Indications: COVID 19. Anticoagulation: Lovenox. Comparison Study: Patient had a Lower extremity venous performed at another                   facility on 03/30/19 the results were inconclusive. The exam                   performed today only the Common Femoral proximal and mid were                   adequately visualized with no evidence of DVT in these areas.                   Very limited exam due to patient habitus Performing Technologist: Darlina Sicilian RDCS  Examination Guidelines: A complete evaluation includes B-mode imaging, spectral Doppler, color Doppler, and power Doppler as needed of all accessible portions of each vessel. Bilateral testing is considered an integral part of a complete examination. Limited examinations for reoccurring indications may be performed as noted.  +---------+---------------+---------+-----------+----------+--------------+  RIGHT     Compressibility Phasicity Spontaneity Properties Thrombus Aging  +---------+---------------+---------+-----------+----------+--------------+  CFV       Full                                                             +---------+---------------+---------+-----------+----------+--------------+  SFJ       Full                                                             +---------+---------------+---------+-----------+----------+--------------+  FV Prox  Full                                                             +---------+---------------+---------+-----------+----------+--------------+  FV Mid    Full                                                             +---------+---------------+---------+-----------+----------+--------------+  FV Distal                                                  Not visualized   +---------+---------------+---------+-----------+----------+--------------+  POP                                                        Not visualized  +---------+---------------+---------+-----------+----------+--------------+  PTV       Full                                                             +---------+---------------+---------+-----------+----------+--------------+  Soleal                                                     Not visualized  +---------+---------------+---------+-----------+----------+--------------+   +---------+---------------+---------+-----------+----------+-------------------+  LEFT      Compressibility Phasicity Spontaneity Properties Thrombus Aging       +---------+---------------+---------+-----------+----------+-------------------+  CFV       Full            Yes       Yes                                         +---------+---------------+---------+-----------+----------+-------------------+  SFJ       Full                                                                  +---------+---------------+---------+-----------+----------+-------------------+  FV Prox   Full                                                                  +---------+---------------+---------+-----------+----------+-------------------+  FV Mid    Full                                                                  +---------+---------------+---------+-----------+----------+-------------------+  FV Distal                                                  able to visualize                                                                in long axis with                                                                color flow due to                                                                the depth of the                                                                 vein unable to                                                                   compress              +---------+---------------+---------+-----------+----------+-------------------+  PTV       Full                                                                  +---------+---------------+---------+-----------+----------+-------------------+  PERO  Not visualized       +---------+---------------+---------+-----------+----------+-------------------+    Summary: Right: Portions of this examination were limited- see technologist comments above. There is no evidence of deep vein thrombosis in the lower extremity. Left: Portions of this examination were limited- see technologist comments above. There is no evidence of deep vein thrombosis in the lower extremity.  *See table(s) above for measurements and observations. Electronically signed by Curt Jews MD on 05/01/2019 at 4:38:48 PM.    Final

## 2019-05-03 NOTE — TOC Initial Note (Signed)
Transition of Care [facility]) - Initial/Assessment Note    Patient Details  Name: Jamie Burton MRN: [id number] Date of Birth: Jun 10, 1966  [facility]) CM/SW Contact:    Ninfa Meeker, RN Phone Number: [phone] (working remotely) 05/03/2019, 1:56 PM  Clinical Narrative:  53 y.o. female admitted and being treated for COVID 19. Patient works at the [employer].  Case manager spoke with patient via telephone concerning discharge needs. Called information for Eliquis 30 day free card to her pharmacy, [place], Spoke with [place], pharmacist. Provided him with card information: BIN [id number] PCN [id number]  GRP [id number] ID [id number]. Case Manager discussed need for home oxygen, referral called to Learta Codding, [facility] Liaison. Will continue to monitor for further needs.              Expected Discharge Plan: Home/Self Care Barriers to Discharge: No Barriers Identified   Patient Goals and CMS Choice     Choice offered to / list presented to : Patient  Expected Discharge Plan and Services Expected Discharge Plan: Home/Self Care   Discharge Planning Services: CM Consult Post Acute Care Choice: Durable Medical Equipment Living arrangements for the past 2 months: Single Family Home Expected Discharge Date: 05/03/19               DME Arranged: Oxygen   Date DME Agency Contacted: 05/03/19 Time DME Agency Contacted: 69 Representative spoke with at DME Agency: Learta Codding [facility] Arranged: NA          Prior Living Arrangements/Services Living arrangements for the past 2 months: Single Family Home Lives with:: Self   Do you feel safe going back to the place where you live?: Yes               Activities of Daily Living Home Assistive Devices/Equipment: None ADL Screening (condition at time of admission) Patient's cognitive ability adequate to safely complete daily activities?: Yes Is the patient deaf or have difficulty hearing?: No Does the patient have  difficulty seeing, even when wearing glasses/contacts?: No Does the patient have difficulty concentrating, remembering, or making decisions?: No Patient able to express need for assistance with ADLs?: Yes Does the patient have difficulty dressing or bathing?: No Independently performs ADLs?: Yes (appropriate for developmental age) Does the patient have difficulty walking or climbing stairs?: No Weakness of Legs: None Weakness of Arms/Hands: None  Permission Sought/Granted Permission sought to share information with : Case Manager                Emotional Assessment   Attitude/Demeanor/Rapport: Gracious   Orientation: : Oriented to Self, Oriented to Place, Oriented to  Time, Oriented to Situation Alcohol / Substance Use: Not Applicable    Admission diagnosis:  COVID 19 VIRUS INFECTION Patient Active Problem List   Diagnosis Date Noted  . Elevated d-dimer 05/01/2019  . Pneumonia due to COVID-19 virus 04/30/2019  . Hypertension   . Hyperlipidemia    PCP:  Caryl Bis, MD Pharmacy:   [place], [place] [address] [place] [address] Phone: [phone] Fax: [phone]     Social Determinants of Health (SDOH) Interventions    Readmission Risk Interventions No flowsheet data found.

## 2019-05-04 LAB — CBC WITH DIFFERENTIAL/PLATELET
Abs Immature Granulocytes: 0.72 10*3/uL — ABNORMAL HIGH (ref 0.00–0.07)
Basophils Absolute: 0.1 10*3/uL (ref 0.0–0.1)
Basophils Relative: 0 %
Eosinophils Absolute: 0 10*3/uL (ref 0.0–0.5)
Eosinophils Relative: 0 %
HCT: 40.7 % (ref 36.0–46.0)
Hemoglobin: 12.4 g/dL (ref 12.0–15.0)
Immature Granulocytes: 5 %
Lymphocytes Relative: 8 %
Lymphs Abs: 1.1 10*3/uL (ref 0.7–4.0)
MCH: 26.7 pg (ref 26.0–34.0)
MCHC: 30.5 g/dL (ref 30.0–36.0)
MCV: 87.5 fL (ref 80.0–100.0)
Monocytes Absolute: 0.8 10*3/uL (ref 0.1–1.0)
Monocytes Relative: 6 %
Neutro Abs: 10.9 10*3/uL — ABNORMAL HIGH (ref 1.7–7.7)
Neutrophils Relative %: 81 %
Platelets: 185 10*3/uL (ref 150–400)
RBC: 4.65 MIL/uL (ref 3.87–5.11)
RDW: 14.6 % (ref 11.5–15.5)
WBC: 13.5 10*3/uL — ABNORMAL HIGH (ref 4.0–10.5)
nRBC: 0 % (ref 0.0–0.2)

## 2019-05-04 LAB — COMPREHENSIVE METABOLIC PANEL
ALT: 36 U/L (ref 0–44)
AST: 24 U/L (ref 15–41)
Albumin: 3.2 g/dL — ABNORMAL LOW (ref 3.5–5.0)
Alkaline Phosphatase: 60 U/L (ref 38–126)
Anion gap: 12 (ref 5–15)
BUN: 28 mg/dL — ABNORMAL HIGH (ref 6–20)
CO2: 23 mmol/L (ref 22–32)
Calcium: 8.3 mg/dL — ABNORMAL LOW (ref 8.9–10.3)
Chloride: 105 mmol/L (ref 98–111)
Creatinine, Ser: 0.83 mg/dL (ref 0.44–1.00)
GFR calc Af Amer: >60 mL/min (ref >60)
GFR calc non Af Amer: >60 mL/min (ref >60)
Glucose, Bld: 169 mg/dL — ABNORMAL HIGH (ref 70–99)
Potassium: 4.4 mmol/L (ref 3.5–5.1)
Sodium: 140 mmol/L (ref 135–145)
Total Bilirubin: 0.4 mg/dL (ref 0.3–1.2)
Total Protein: 6.9 g/dL (ref 6.5–8.1)

## 2019-05-04 LAB — PROCALCITONIN: Procalcitonin: <0.1 ng/mL

## 2019-05-04 LAB — GLUCOSE, CAPILLARY
Glucose-Capillary: 240 mg/dL — ABNORMAL HIGH (ref 70–99)
Glucose-Capillary: 176 mg/dL — ABNORMAL HIGH (ref 70–99)

## 2019-05-04 LAB — BRAIN NATRIURETIC PEPTIDE: B Natriuretic Peptide: 472.5 pg/mL — ABNORMAL HIGH (ref 0.0–100.0)

## 2019-05-04 LAB — C-REACTIVE PROTEIN: CRP: 0.9 mg/dL (ref <1.0)

## 2019-05-04 LAB — LACTATE DEHYDROGENASE: LDH: 360 U/L — ABNORMAL HIGH (ref 98–192)

## 2019-05-04 LAB — HEPARIN LEVEL (UNFRACTIONATED): Heparin Unfractionated: 0.55 IU/mL (ref 0.30–0.70)

## 2019-05-04 LAB — MAGNESIUM: Magnesium: 2.3 mg/dL (ref 1.7–2.4)

## 2019-05-04 LAB — D-DIMER, QUANTITATIVE: D-Dimer, Quant: 12.7 ug/mL-FEU — ABNORMAL HIGH (ref 0.00–0.50)

## 2019-05-04 LAB — FERRITIN: Ferritin: 175 ng/mL (ref 11–307)

## 2019-05-04 MED ORDER — APIXABAN 5 MG PO TABS: 5.0000 mg | ORAL_TABLET | Freq: Two times a day (BID) | ORAL | Status: DC

## 2019-05-04 MED ORDER — APIXABAN 5 MG PO TABS
10.0000 mg | ORAL_TABLET | Freq: Two times a day (BID) | ORAL | Status: DC
  Administered 2019-05-04 – 2019-05-05 (×2): 10 mg via ORAL
  Filled 2019-05-04 (×2): qty 2

## 2019-05-04 MED ORDER — FUROSEMIDE 10 MG/ML IJ SOLN
40.0000 mg | Freq: Once | INTRAMUSCULAR | Status: AC
  Administered 2019-05-04: 11:00:00 40 mg via INTRAVENOUS
  Filled 2019-05-04: qty 4

## 2019-05-04 NOTE — Progress Notes (Signed)
ANTICOAGULATION CONSULT NOTE - Follow Up Consult  Pharmacy Consult for heparin > apixaban  Indication: R/o VTE treatment  No Known Allergies  Patient Measurements: Height: 5\' 5"  (165.1 cm) Weight: (!) 480 lb (217.7 kg) IBW/kg (Calculated) : 57  Actual body weight: 217 Heparin Dosing Weight: 114 kg   Vital Signs: Temp: 97.2 F (36.2 C) (09/05 0400) Temp Source: Oral (09/05 0400) BP: 126/74 (09/05 0400) Pulse Rate: 65 (09/05 0400)  Labs: Recent Labs    05/02/19 0633 05/03/19 0302 05/04/19 0436  HGB 13.0 12.2 12.4  HCT 42.2 40.8 40.7  PLT 215 185 185  HEPARINUNFRC 0.45 0.45 0.55  CREATININE 1.22* 1.01* 0.83    Estimated Creatinine Clearance: 153.6 mL/min (by C-G formula based on SCr of 0.83 mg/dL).   Medical History: Past Medical History:  Diagnosis Date  . Arthritis   . Dyspnea   . Heart murmur    told she had a heart murmur as a child but unsure now  . Hyperlipidemia   . Hypertension   . Pneumonia due to COVID-19 virus 04/30/2019  . Pre-diabetes     Medications:  Medications Prior to Admission  Medication Sig Dispense Refill Last Dose  . atenolol (TENORMIN) 50 MG tablet Take 50 mg by mouth daily.   04/28/2019 at 0700  . hydrochlorothiazide (HYDRODIURIL) 25 MG tablet Take 25 mg by mouth daily.   04/28/2019 at Unknown time  . ibuprofen (ADVIL) 200 MG tablet Take 200 mg by mouth every 6 (six) hours as needed for moderate pain.   unk  . lovastatin (MEVACOR) 20 MG tablet Take 20 mg by mouth 2 (two) times daily.   04/28/2019 at Unknown time    Assessment: 31 YOF with concern for LE VTE on IV heparin. LE venous duplex was inconclusive.  HL this AM was therapeutic. Now transitioning to apixaban. H/H wnl and plt wnl. SCr wnl    Goal of Therapy:  Heparin level 0.3-0.7 units/ml Monitor platelets by anticoagulation protocol: Yes   Plan:   Continue  IV heparin at 1650 units/hr until 1700 today   At 1700, stop IV heparin and start apixaban 10 mg twice daily x 7  days followed by apixaban 5 mg twice daily   Monitor renal fx and s/s of bleeding     Albertina Parr, PharmD., BCPS Clinical Pharmacist Clinical phone for 05/04/19 until 5pm: [phone]

## 2019-05-04 NOTE — Progress Notes (Signed)
ANTICOAGULATION CONSULT NOTE - Follow Up Consult  Pharmacy Consult for heparin Indication: R/o VTE treatment  No Known Allergies  Patient Measurements: Height: 5\' 5"  (165.1 cm) Weight: (!) 480 lb (217.7 kg) IBW/kg (Calculated) : 57  Actual body weight: 217 Heparin Dosing Weight: 114 kg   Vital Signs: Temp: 97.2 F (36.2 C) (09/05 0400) Temp Source: Oral (09/05 0400) BP: 126/74 (09/05 0400) Pulse Rate: 65 (09/05 0400)  Labs: Recent Labs    05/02/19 0633 05/03/19 0302 05/04/19 0436  HGB 13.0 12.2 12.4  HCT 42.2 40.8 40.7  PLT 215 185 185  HEPARINUNFRC 0.45 0.45 0.55  CREATININE 1.22* 1.01* 0.83    Estimated Creatinine Clearance: 153.6 mL/min (by C-G formula based on SCr of 0.83 mg/dL).   Medical History: Past Medical History:  Diagnosis Date  . Arthritis   . Dyspnea   . Heart murmur    told she had a heart murmur as a child but unsure now  . Hyperlipidemia   . Hypertension   . Pneumonia due to COVID-19 virus 04/30/2019  . Pre-diabetes     Medications:  Medications Prior to Admission  Medication Sig Dispense Refill Last Dose  . atenolol (TENORMIN) 50 MG tablet Take 50 mg by mouth daily.   04/28/2019 at 0700  . hydrochlorothiazide (HYDRODIURIL) 25 MG tablet Take 25 mg by mouth daily.   04/28/2019 at Unknown time  . ibuprofen (ADVIL) 200 MG tablet Take 200 mg by mouth every 6 (six) hours as needed for moderate pain.   unk  . lovastatin (MEVACOR) 20 MG tablet Take 20 mg by mouth 2 (two) times daily.   04/28/2019 at Unknown time    Assessment: 72 YOF with concern for LE VTE on IV heparin. LE venous duplex was inconclusive.    Today, 05/04/19   HL is 0.55, therapeutic   SCr improving now 0.83  Hgb 12.4, plt 185, WNL, Stable  No line or bleeding issues per RN    Goal of Therapy:  Heparin level 0.3-0.7 units/ml Monitor platelets by anticoagulation protocol: Yes   Plan:   Continue  IV heparin at 1650 units/hr  Monitor daily HL, CBC and s/s of  bleeding   Planning to discharge on oral anticoagulant per MD     Royetta Asal, PharmD, BCPS 05/04/2019 6:37 AM

## 2019-05-04 NOTE — Discharge Instructions (Addendum)
Follow with Primary MD Richardean Chimeraaniel, Terry G, MD in 7 days   Get CBC, CMP, 2 view Chest X ray -  checked next visit within 1 week by Primary MD   Activity: As tolerated with Full fall precautions use walker/cane & assistance as needed  Disposition Home   Diet: Heart Healthy   Special Instructions: If you have smoked or chewed Tobacco  in the last 2 yrs please stop smoking, stop any regular Alcohol  and or any Recreational drug use.  On your next visit with your primary care physician please Get Medicines reviewed and adjusted.  Please request your Prim.MD to go over all Hospital Tests and Procedure/Radiological results at the follow up, please get all Hospital records sent to your Prim MD by signing hospital release before you go home.  If you experience worsening of your admission symptoms, develop shortness of breath, life threatening emergency, suicidal or homicidal thoughts you must seek medical attention immediately by calling 911 or calling your MD immediately  if symptoms less severe.  You Must read complete instructions/literature along with all the possible adverse reactions/side effects for all the Medicines you take and that have been prescribed to you. Take any new Medicines after you have completely understood and accpet all the possible adverse reactions/side effects.                                                                            [facility]                            [address]                            [place], [place] [address]      Jamie ManeSharon Printup was admitted to the Hospital on 04/30/2019 and Discharged  05/05/2019 and should be excused from work/school   for 21  days starting from date -  04/30/2019 , may return to work/school without any restrictions.  Call Susa RaringPrashant Jaylan Hinojosa MD, [facility]  [phone] with questions.  Susa RaringPrashant Sausha Raymond M.D on 05/05/2019,at 8:42 AM  [facility]   Office  [phone]      Person Under  Monitoring Name: Jamie ManeSharon Gipe  Location: [address] [place] [id number]   Infection Prevention Recommendations for Individuals Confirmed to have, or Being Evaluated for, 2019 Novel Coronavirus (COVID-19) Infection Who Receive Care at Home  Individuals who are confirmed to have, or are being evaluated for, COVID-19 should follow the prevention steps below until a healthcare provider or local or state health department says they can return to normal activities.  Stay home except to get medical care You should restrict activities outside your home, except for getting medical care. Do not go to work, school, or public areas, and do not use public transportation or taxis.  Call ahead before visiting your doctor Before your medical appointment, call the healthcare provider and tell them that you have, or are being evaluated for, COVID-19 infection. This will help the healthcare providers office take steps to keep other people from getting infected. Ask your healthcare provider to call the  local or state health department.  Monitor your symptoms Seek prompt medical attention if your illness is worsening (e.g., difficulty breathing). Before going to your medical appointment, call the healthcare provider and tell them that you have, or are being evaluated for, COVID-19 infection. Ask your healthcare provider to call the local or state health department.  Wear a facemask You should wear a facemask that covers your nose and mouth when you are in the same room with other people and when you visit a healthcare provider. People who live with or visit you should also wear a facemask while they are in the same room with you.  Separate yourself from other people in your home As much as possible, you should stay in a different room from other people in your home. Also, you should use a separate bathroom, if available.  Avoid sharing household items You should not share dishes, drinking glasses,  cups, eating utensils, towels, bedding, or other items with other people in your home. After using these items, you should wash them thoroughly with soap and water.  Cover your coughs and sneezes Cover your mouth and nose with a tissue when you cough or sneeze, or you can cough or sneeze into your sleeve. Throw used tissues in a lined trash can, and immediately wash your hands with soap and water for at least 20 seconds or use an alcohol-based hand rub.  Wash your [employer] Corporationhands Wash your hands often and thoroughly with soap and water for at least 20 seconds. You can use an alcohol-based hand sanitizer if soap and water are not available and if your hands are not visibly dirty. Avoid touching your eyes, nose, and mouth with unwashed hands.   Prevention Steps for Caregivers and Household Members of Individuals Confirmed to have, or Being Evaluated for, COVID-19 Infection Being Cared for in the Home  If you live with, or provide care at home for, a person confirmed to have, or being evaluated for, COVID-19 infection please follow these guidelines to prevent infection:  Follow healthcare providers instructions Make sure that you understand and can help the patient follow any healthcare provider instructions for all care.  Provide for the patients basic needs You should help the patient with basic needs in the home and provide support for getting groceries, prescriptions, and other personal needs.  Monitor the patients symptoms If they are getting sicker, call his or her medical provider and tell them that the patient has, or is being evaluated for, COVID-19 infection. This will help the healthcare providers office take steps to keep other people from getting infected. Ask the healthcare provider to call the local or state health department.  Limit the number of people who have contact with the patient  If possible, have only one caregiver for the patient.  Other household members should  stay in another home or place of residence. If this is not possible, they should stay  in another room, or be separated from the patient as much as possible. Use a separate bathroom, if available.  Restrict visitors who do not have an essential need to be in the home.  Keep older adults, very young children, and other sick people away from the patient Keep older adults, very young children, and those who have compromised immune systems or chronic health conditions away from the patient. This includes people with chronic heart, lung, or kidney conditions, diabetes, and cancer.  Ensure good ventilation Make sure that shared spaces in the home have good air flow,  such as from an air conditioner or an opened window, weather permitting.  Wash your hands often  Wash your hands often and thoroughly with soap and water for at least 20 seconds. You can use an alcohol based hand sanitizer if soap and water are not available and if your hands are not visibly dirty.  Avoid touching your eyes, nose, and mouth with unwashed hands.  Use disposable paper towels to dry your hands. If not available, use dedicated cloth towels and replace them when they become wet.  Wear a facemask and gloves  Wear a disposable facemask at all times in the room and gloves when you touch or have contact with the patients blood, body fluids, and/or secretions or excretions, such as sweat, saliva, sputum, nasal mucus, vomit, urine, or feces.  Ensure the mask fits over your nose and mouth tightly, and do not touch it during use.  Throw out disposable facemasks and gloves after using them. Do not reuse.  Wash your hands immediately after removing your facemask and gloves.  If your personal clothing becomes contaminated, carefully remove clothing and launder. Wash your hands after handling contaminated clothing.  Place all used disposable facemasks, gloves, and other waste in a lined container before disposing them with other  household waste.  Remove gloves and wash your hands immediately after handling these items.  Do not share dishes, glasses, or other household items with the patient  Avoid sharing household items. You should not share dishes, drinking glasses, cups, eating utensils, towels, bedding, or other items with a patient who is confirmed to have, or being evaluated for, COVID-19 infection.  After the person uses these items, you should wash them thoroughly with soap and water.  Wash laundry thoroughly  Immediately remove and wash clothes or bedding that have blood, body fluids, and/or secretions or excretions, such as sweat, saliva, sputum, nasal mucus, vomit, urine, or feces, on them.  Wear gloves when handling laundry from the patient.  Read and follow directions on labels of laundry or clothing items and detergent. In general, wash and dry with the warmest temperatures recommended on the label.  Clean all areas the individual has used often  Clean all touchable surfaces, such as counters, tabletops, doorknobs, bathroom fixtures, toilets, phones, keyboards, tablets, and bedside tables, every day. Also, clean any surfaces that may have blood, body fluids, and/or secretions or excretions on them.  Wear gloves when cleaning surfaces the patient has come in contact with.  Use a diluted bleach solution (e.g., dilute bleach with 1 part bleach and 10 parts water) or a household disinfectant with a label that says EPA-registered for coronaviruses. To make a bleach solution at home, add 1 tablespoon of bleach to 1 quart (4 cups) of water. For a larger supply, add  cup of bleach to 1 gallon (16 cups) of water.  Read labels of cleaning products and follow recommendations provided on product labels. Labels contain instructions for safe and effective use of the cleaning product including precautions you should take when applying the product, such as wearing gloves or eye protection and making sure you have  good ventilation during use of the product.  Remove gloves and wash hands immediately after cleaning.  Monitor yourself for signs and symptoms of illness Caregivers and household members are considered close contacts, should monitor their health, and will be asked to limit movement outside of the home to the extent possible. Follow the monitoring steps for close contacts listed on the symptom monitoring form.   ?  If you have additional questions, contact your local health department or call the epidemiologist on call at [phone] (available 24/7). ? This guidance is subject to change. For the most up-to-date guidance from [facility], please refer to their website: TripMetro.hu  Information on my medicine - ELIQUIS (apixaban)  This medication education was reviewed with me or my healthcare representative as part of my discharge preparation.  The pharmacist that spoke with me during my hospital stay was:  Sampson Si, [facility]  Why was Eliquis prescribed for you? Eliquis was prescribed to treat blood clots that may have been found in the veins of your legs (deep vein thrombosis) or in your lungs (pulmonary embolism) and to reduce the risk of them occurring again.  What do You need to know about Eliquis ? The starting dose is 10 mg (two 5 mg tablets) taken TWICE daily for the FIRST SEVEN (7) DAYS, then on (enter date)  05/12/2019  the dose is reduced to ONE 5 mg tablet taken TWICE daily.  Eliquis may be taken with or without food.   Try to take the dose about the same time in the morning and in the evening. If you have difficulty swallowing the tablet whole please discuss with your pharmacist how to take the medication safely.  Take Eliquis exactly as prescribed and DO NOT stop taking Eliquis without talking to the doctor who prescribed the medication.  Stopping may increase your risk of developing a new blood clot.  Refill  your prescription before you run out.  After discharge, you should have regular check-up appointments with your healthcare provider that is prescribing your Eliquis.    What do you do if you miss a dose? If a dose of ELIQUIS is not taken at the scheduled time, take it as soon as possible on the same day and twice-daily administration should be resumed. The dose should not be doubled to make up for a missed dose.  Important Safety Information A possible side effect of Eliquis is bleeding. You should call your healthcare provider right away if you experience any of the following: ? Bleeding from an injury or your nose that does not stop. ? Unusual colored urine (red or dark brown) or unusual colored stools (red or black). ? Unusual bruising for unknown reasons. ? A serious fall or if you hit your head (even if there is no bleeding).  Some medicines may interact with Eliquis and might increase your risk of bleeding or clotting while on Eliquis. To help avoid this, consult your healthcare provider or pharmacist prior to using any new prescription or non-prescription medications, including herbals, vitamins, non-steroidal anti-inflammatory drugs (NSAIDs) and supplements.  This website has more information on Eliquis (apixaban): http://www.eliquis.com/eliquis/home

## 2019-05-04 NOTE — Progress Notes (Signed)
Physical Therapy Treatment Patient Details Name: Jamie Burton MRN: [id number] DOB: [birth year] Today's Date: 05/04/2019    History of Present Illness 53 y.o. female admitted on 04/30/19 for SOB due to COVID 19 PNA.  Pt also with significantly elevated d-dimer, negative DVT, but suspicious due to high d-dimer, so placed on heparin drip.  Pt with significant PMH of pre diabetes, HTN, arthritis.      PT Comments    Pt was able to progress gait a short distance down the hallway but required 6 L O2 Flowella for sats lowest recorded 87%.  She was able to stand and PLB and bring sats back to 90% in less than 3 mins.  O2 measured by pediatric finger probe.  UE/LE HEP given and reviewed.  PT returned to 3.5 L O2 at end of session with sats 92%.  PT will continue to follow acutely for safe mobility progression   Follow Up Recommendations  No PT follow up     Equipment Recommendations  Other (comment)(home O2)    Recommendations for Other Services   NA     Precautions / Restrictions Precautions Precautions: Other (comment) Precaution Comments: monitor O2    Mobility  Bed Mobility               General bed mobility comments: Pt was OOB in the recliner chair.   Transfers Overall transfer level: Needs assistance Equipment used: None Transfers: Sit to/from Stand Sit to Stand: Supervision         General transfer comment: supervision for safety  Ambulation/Gait Ambulation/Gait assistance: Supervision Gait Distance (Feet): 65 Feet Assistive device: IV Pole Gait Pattern/deviations: Step-through pattern;Wide base of support Gait velocity: decreased Gait velocity interpretation: 1.31 - 2.62 ft/sec, indicative of limited community ambulator General Gait Details: slow, mildly staggering gait pattern, standing rest break 1/2 way       Balance Overall balance assessment: Mild deficits observed, not formally tested                                           Cognition Arousal/Alertness: Awake/alert Behavior During Therapy: WFL for tasks assessed/performed Overall Cognitive Status: Within Functional Limits for tasks assessed                                        Exercises General Exercises - Upper Extremity Shoulder ABduction: AROM;Both;10 reps;Seated;Theraband Theraband Level (Shoulder Abduction): Level 1 (Yellow) Shoulder Horizontal ABduction: AROM;Both;10 reps;Seated;Theraband Theraband Level (Shoulder Horizontal Abduction): Level 1 (Yellow) Elbow Flexion: AROM;Both;10 reps;Seated;Theraband Theraband Level (Elbow Flexion): Level 1 (Yellow) Elbow Extension: AROM;Both;10 reps;Seated;Theraband Theraband Level (Elbow Extension): Level 1 (Yellow) General Exercises - Lower Extremity Hip ABduction/ADduction: AROM;Both;10 reps Hip Flexion/Marching: AROM;Both;10 reps    General Comments General comments (skin integrity, edema, etc.): O2 sats decreased to 87% on 6 L O2  during gait, <3 min recovery with standing PLB up to 90 as measured by finger peds probe.      Pertinent Vitals/Pain Pain Assessment: No/denies pain           PT Goals (current goals can now be found in the care plan section) Acute Rehab PT Goals Patient Stated Goal: to be able to go home by Monday Progress towards PT goals: Progressing toward goals    Frequency    Min 3X/week  PT Plan Current plan remains appropriate       AM-PAC PT "6 Clicks" Mobility   Outcome Measure  Help needed turning from your back to your side while in a flat bed without using bedrails?: A Little Help needed moving from lying on your back to sitting on the side of a flat bed without using bedrails?: A Little Help needed moving to and from a bed to a chair (including a wheelchair)?: None Help needed standing up from a chair using your arms (e.g., wheelchair or bedside chair)?: None Help needed to walk in hospital room?: None Help needed climbing 3-5 steps  with a railing? : A Little 6 Click Score: 21    End of Session Equipment Utilized During Treatment: Oxygen(3.5-6 L) Activity Tolerance: Patient limited by fatigue Patient left: in chair;with call bell/phone within reach   PT Visit Diagnosis: Muscle weakness (generalized) (M62.81);Difficulty in walking, not elsewhere classified (R26.2)     Time: [id number] PT Time Calculation (min) (ACUTE ONLY): 35 min  Charges:  $Gait Training: 8-22 mins $Therapeutic Exercise: 8-22 mins          Yashira Offenberger B. Hance Caspers, PT, DPT  Acute Rehabilitation [phone] pager [phone] office  @ [place]: [phone]            05/04/2019, 4:25 PM

## 2019-05-04 NOTE — Progress Notes (Signed)
PROGRESS NOTE                                                                                                                                                                                                             Patient Demographics:    Jamie Burton, is a 53 y.o. female, DOB - 1966-05-03, ULA:[id number]  Outpatient Primary MD for the patient is Caryl Bis, MD    LOS - 4  Admit date - 04/30/2019    CC - SOB     Brief Narrative  Jamie Burton a 53 y.o.BF PMHx OA, dyspnea, childhood heart murmur, HTN, HLD,  prediabetes, morbid obesity,    [facility] 3 days associated with fatigue, malaise, chills, night sweats, but no fever. She works at a [employer] home and tested positive on 04/26/2027, was diagnosed with COVID-19 pneumonitis with extremely high d-dimer and transferred here to ICU for further care.   Subjective:   Patient in bed, appears comfortable, denies any headache, no fever, no chest pain or pressure, improved shortness of breath , no abdominal pain. No focal weakness.   Assessment  & Plan :     1. Acute Hypoxic Resp. Failure due to Acute Covid 19 Viral Pneumonitis during the ongoing 2020 Covid 19 Pandemic - she was treated with IV steroids + Remdisvir along with full anticoagulation due to extremely high d-dimer.  Renal function precluded to CT angiogram.  Lower extremity venous duplex was not a high quality study but was negative.  Echocardiogram did not show any RV dysfunction.  At this time this line of treatment will be continued as d-dimer continues to be extremely high, she could have a lower extremity or intra-abdominal clot which could explain her extremely high d-dimer, she has been treated with IV heparin which will be transitioned to oral Eliquis on 05/04/2019 as d-dimer finally is trending down.  She will be discharged  on oral anticoagulation for at least 3-6 months with outpatient repeat ultrasound by PCP and repeat d-dimer checks in the outpatient setting until she stabilizes.  For her COVID-19 pneumonitis and possibility of PE she will continue steroids, finish her IV Remdisvir and continue full anticoagulation now and upon discharge.  Likely discharge in the next 1 to 2 days if stable.  COVID-19 Labs  Recent  Labs    05/02/19 0633 05/03/19 0302 05/04/19 0436  DDIMER >20.00* 19.29* 12.70*  FERRITIN 329* 246 175  LDH  --  375* 360*  CRP 3.5* 1.5* 0.9    No results found for: SARSCOV2NAA   Hepatic Function Latest Ref Rng & Units 05/04/2019 05/03/2019 05/02/2019  Total Protein 6.5 - 8.1 g/dL 6.9 7.1 7.6  Albumin 3.5 - 5.0 g/dL 3.2(L) 3.2(L) 3.4(L)  AST 15 - 41 U/L 24 22 33  ALT 0 - 44 U/L 36 32 37  Alk Phosphatase 38 - 126 U/L 60 61 70  Total Bilirubin 0.3 - 1.2 mg/dL 0.4 0.4 0.6        Component Value Date/Time   BNP 472.5 (H) 05/04/2019 0436      2.  Extremely elevated d-dimer.  See above.  Outpatient hematology follow-up also recommended.  3.  Morbid obesity.  Follow with PCP for weight loss.  4.  Chronic diastolic dysfunction EF 20%.  Currently compensated.  5.  Hypertension.  Poor control with beta-blocker will add Norvasc for better control.    Condition - Fair  Family Communication  :  None  Code Status :  Full  Diet :   Diet Order            Diet regular Room service appropriate? Yes; Fluid consistency: Thin  Diet effective now               Disposition Plan  :  Home in a.m. most likely  Consults  :  PCCM  Procedures  :  Venous US -  Negative  TTE -        1. The left ventricle has hyperdynamic systolic function, with an ejection fraction of >65%. The cavity size was normal. There is moderately increased left ventricular wall thickness. No evidence of left ventricular regional wall motion abnormalities.  2. The right ventricle has normal systolc function.  The cavity was normal. There is no increase in right ventricular wall thickness. Right ventricular systolic pressure is normal with an estimated pressure of 36.0 mmHg.  3. The aortic valve was not well visualized.  4. Pulmonic valve regurgitation was not assessed by color flow Doppler.    PUD Prophylaxis : None  DVT Prophylaxis  :    Heparin gtt  Lab Results  Component Value Date   PLT 185 05/04/2019    Inpatient Medications  Scheduled Meds:  amLODipine  10 mg Oral Daily   apixaban  10 mg Oral BID   Followed by   Derrill Memo ON 05/12/2019] apixaban  5 mg Oral BID   Chlorhexidine Gluconate Cloth  6 each Topical Daily   insulin aspart  0-20 Units Subcutaneous TID WC   methylPREDNISolone (SOLU-MEDROL) injection  40 mg Intravenous Q12H   Continuous Infusions:  heparin 1,650 Units/hr (05/03/19 1232)   remdesivir 100 mg in NS 250 mL 100 mg (05/03/19 0838)   PRN Meds:.Ipratropium-Albuterol, prochlorperazine, sodium chloride flush  Antibiotics  :    Anti-infectives (From admission, onward)   Start     Dose/Rate Route Frequency Ordered Stop   05/02/19 1000  remdesivir 100 mg in sodium chloride 0.9 % 250 mL IVPB     100 mg 500 mL/hr over 30 Minutes Intravenous Every 24 hours 05/01/19 0817 05/06/19 0959   05/02/19 0900  remdesivir 100 mg in sodium chloride 0.9 % 250 mL IVPB  Status:  Discontinued     100 mg 500 mL/hr over 30 Minutes Intravenous Every 24 hours 05/01/19 0805 05/01/19  6761   05/01/19 1200  cefTRIAXone (ROCEPHIN) 2 g in sodium chloride 0.9 % 100 mL IVPB  Status:  Discontinued     2 g 200 mL/hr over 30 Minutes Intravenous Every 24 hours 05/01/19 0452 05/03/19 0746   05/01/19 1200  azithromycin (ZITHROMAX) 500 mg in sodium chloride 0.9 % 250 mL IVPB  Status:  Discontinued     500 mg 250 mL/hr over 60 Minutes Intravenous Every 24 hours 05/01/19 0452 05/03/19 0746   05/01/19 1000  remdesivir 200 mg in sodium chloride 0.9 % 250 mL IVPB     200 mg 500 mL/hr over 30  Minutes Intravenous Once 05/01/19 0817 05/01/19 1059   05/01/19 0900  remdesivir 200 mg in sodium chloride 0.9 % 250 mL IVPB  Status:  Discontinued     200 mg 500 mL/hr over 30 Minutes Intravenous Once 05/01/19 0805 05/01/19 0817       Time Spent in minutes  30   Lala Lund M.D on 05/04/2019 at 9:10 AM  To page go to [email or web address] - password [facility]  [facility] -  Office  [phone]    See all Orders from today for further details    Objective:   Vitals:   05/04/19 0200 05/04/19 0305 05/04/19 0400 05/04/19 0733  BP:   126/74 118/75  Pulse: 64 74 65 72  Resp:      Temp:   (!) 97.2 F (36.2 C) 98.1 F (36.7 C)  TempSrc:   Oral Oral  SpO2: 91% 93% 97% 97%  Weight:      Height:        Wt Readings from Last 3 Encounters:  05/01/19 (!) 217.7 kg     Intake/Output Summary (Last 24 hours) at 05/04/2019 0910 Last data filed at 05/04/2019 0600 Gross per 24 hour  Intake 348 ml  Output --  Net 348 ml     Physical Exam  Awake Alert, Oriented X 3, No new F.N deficits, Normal affect [place].AT,PERRAL Supple Neck,No JVD, No cervical lymphadenopathy appriciated.  Symmetrical Chest wall movement, Good air movement bilaterally, CTAB RRR,No Gallops, Rubs or new Murmurs, No Parasternal Heave +ve B.Sounds, Abd Soft, No tenderness, No organomegaly appriciated, No rebound - guarding or rigidity. No Cyanosis, Clubbing or edema, No new Rash or bruise   Data Review:    CBC Recent Labs  Lab 04/30/19 2330 05/01/19 0855 05/02/19 0633 05/03/19 0302 05/04/19 0436  WBC 6.9 11.2* 13.2* 14.4* 13.5*  HGB 13.7 13.4 13.0 12.2 12.4  HCT 43.6 42.8 42.2 40.8 40.7  PLT 176 180 215 185 185  MCV 84.8 85.8 86.3 87.4 87.5  MCH 26.7 26.9 26.6 26.1 26.7  MCHC 31.4 31.3 30.8 29.9* 30.5  RDW 14.6 14.6 14.6 14.7 14.6  LYMPHSABS 0.8 1.1 1.1 1.0 1.1  MONOABS 0.5 1.1* 0.6 0.9 0.8  EOSABS 0.0 0.0 0.0 0.0 0.0  BASOSABS 0.0 0.0 0.0 0.0 0.1    Chemistries  Recent Labs  Lab 04/30/19 2330  05/01/19 0855 05/02/19 0633 05/03/19 0302 05/04/19 0436  NA 138  --  142 141 140  K 4.4  --  4.4 4.4 4.4  CL 102  --  103 104 105  CO2 22  --  25 25 23   GLUCOSE 180*  --  154* 181* 169*  BUN 32*  --  41* 35* 28*  CREATININE 1.46*  --  1.22* 1.01* 0.83  CALCIUM 8.7*  --  8.8* 8.2* 8.3*  MG 2.6* 2.6* 2.4 2.4 2.3  AST 41  --  33 22 24  ALT 36  --  37 32 36  ALKPHOS 72  --  70 61 60  BILITOT 1.2  --  0.6 0.4 0.4   ------------------------------------------------------------------------------------------------------------------ Recent Labs    05/02/19 0633  CHOL 187  HDL 36*  LDLCALC 127*  TRIG 122  CHOLHDL 5.2    Lab Results  Component Value Date   HGBA1C 6.6 (H) 05/01/2019   ------------------------------------------------------------------------------------------------------------------ No results for input(s): TSH, T4TOTAL, T3FREE, THYROIDAB in the last 72 hours.  Invalid input(s): FREET3  Cardiac Enzymes No results for input(s): CKMB, TROPONINI, MYOGLOBIN in the last 168 hours.  Invalid input(s): CK ------------------------------------------------------------------------------------------------------------------    Component Value Date/Time   BNP 472.5 (H) 05/04/2019 0436    Micro Results Recent Results (from the past 240 hour(s))  MRSA PCR Screening     Status: None   Collection Time: 04/30/19 11:05 PM   Specimen: Nasopharyngeal  Result Value Ref Range Status   MRSA by PCR NEGATIVE NEGATIVE Final    Comment:        The GeneXpert MRSA Assay (FDA approved for NASAL specimens only), is one component of a comprehensive MRSA colonization surveillance program. It is not intended to diagnose MRSA infection nor to guide or monitor treatment for MRSA infections. Performed at [facility], [place] [address] [place], [place] [address]     Radiology Reports Dg Chest Port 1 View  Result Date: 05/01/2019 CLINICAL DATA:  Pneumonia. EXAM:  PORTABLE CHEST 1 VIEW COMPARISON:  One-view chest x-ray 04/30/2019 at [facility]. FINDINGS: Heart is enlarged. Lung volumes are low. Progressive bibasilar airspace opacities are present. There is no edema or effusion. IMPRESSION: 1. Slight progression of bibasilar airspace opacities compatible with COVID-19 pneumonia. 2. Cardiomegaly without failure. 3. Low lung volumes. Electronically Signed   By: San Morelle M.D.   On: 05/01/2019 08:54   Vas [place] Lower Extremity Venous (dvt)  Result Date: 05/01/2019  Lower Venous Study Indications: Swelling, and elevated dimer. Other Indications: COVID 19. Anticoagulation: Lovenox. Comparison Study: Patient had a Lower extremity venous performed at another                   facility on 03/30/19 the results were inconclusive. The exam                   performed today only the Common Femoral proximal and mid were                   adequately visualized with no evidence of DVT in these areas.                   Very limited exam due to patient habitus Performing Technologist: Darlina Sicilian RDCS  Examination Guidelines: A complete evaluation includes B-mode imaging, spectral Doppler, color Doppler, and power Doppler as needed of all accessible portions of each vessel. Bilateral testing is considered an integral part of a complete examination. Limited examinations for reoccurring indications may be performed as noted.  +---------+---------------+---------+-----------+----------+--------------+  RIGHT     Compressibility Phasicity Spontaneity Properties Thrombus Aging  +---------+---------------+---------+-----------+----------+--------------+  CFV       Full                                                             +---------+---------------+---------+-----------+----------+--------------+  SFJ       Full                                                             +---------+---------------+---------+-----------+----------+--------------+  FV Prox   Full                                                              +---------+---------------+---------+-----------+----------+--------------+  FV Mid    Full                                                             +---------+---------------+---------+-----------+----------+--------------+  FV Distal                                                  Not visualized  +---------+---------------+---------+-----------+----------+--------------+  POP                                                        Not visualized  +---------+---------------+---------+-----------+----------+--------------+  PTV       Full                                                             +---------+---------------+---------+-----------+----------+--------------+  Soleal                                                     Not visualized  +---------+---------------+---------+-----------+----------+--------------+   +---------+---------------+---------+-----------+----------+-------------------+  LEFT      Compressibility Phasicity Spontaneity Properties Thrombus Aging       +---------+---------------+---------+-----------+----------+-------------------+  CFV       Full            Yes       Yes                                         +---------+---------------+---------+-----------+----------+-------------------+  SFJ       Full                                                                  +---------+---------------+---------+-----------+----------+-------------------+  FV Prox   Full                                                                  +---------+---------------+---------+-----------+----------+-------------------+  FV Mid    Full                                                                  +---------+---------------+---------+-----------+----------+-------------------+  FV Distal                                                  able to visualize                                                                in long axis with                                                                 color flow due to                                                                the depth of the                                                                 vein unable to                                                                   compress             +---------+---------------+---------+-----------+----------+-------------------+  PTV       Full                                                                  +---------+---------------+---------+-----------+----------+-------------------+  PERO                                                       Not visualized       +---------+---------------+---------+-----------+----------+-------------------+    Summary: Right: Portions of this examination were limited- see technologist comments above. There is no evidence of deep vein thrombosis in the lower extremity. Left: Portions of this examination were limited- see technologist comments above. There is no evidence of deep vein thrombosis in the lower extremity.  *See table(s) above for measurements and observations. Electronically signed by Curt Jews MD on 05/01/2019 at 4:38:48 PM.    Final

## 2019-05-05 DIAGNOSIS — U071 COVID-19: Principal | ICD-10-CM

## 2019-05-05 DIAGNOSIS — J1289 Other viral pneumonia: Secondary | ICD-10-CM

## 2019-05-05 LAB — GLUCOSE, CAPILLARY: Glucose-Capillary: 137 mg/dL — ABNORMAL HIGH (ref 70–99)

## 2019-05-05 LAB — HEPARIN LEVEL (UNFRACTIONATED): Heparin Unfractionated: 1.01 IU/mL — ABNORMAL HIGH (ref 0.30–0.70)

## 2019-05-05 LAB — D-DIMER, QUANTITATIVE: D-Dimer, Quant: 9.9 ug/mL-FEU — ABNORMAL HIGH (ref 0.00–0.50)

## 2019-05-05 MED ORDER — APIXABAN 5 MG PO TABS: 10.0000 mg | ORAL_TABLET | Freq: Two times a day (BID) | ORAL | 0 refills | Status: DC

## 2019-05-05 MED ORDER — ALBUTEROL SULFATE HFA 108 (90 BASE) MCG/ACT IN AERS: 2.0000 | INHALATION_SPRAY | Freq: Four times a day (QID) | RESPIRATORY_TRACT | 0 refills | Status: DC | PRN

## 2019-05-05 MED ORDER — HYDROCHLOROTHIAZIDE 25 MG PO TABS
25.0000 mg | ORAL_TABLET | Freq: Every day | ORAL | Status: DC
  Administered 2019-05-05: 25 mg via ORAL
  Filled 2019-05-05 (×2): qty 1

## 2019-05-05 MED ORDER — SODIUM CHLORIDE 0.9 % IV SOLN
100.0000 mg | INTRAVENOUS | Status: AC
  Administered 2019-05-05: 100 mg via INTRAVENOUS
  Filled 2019-05-05: qty 20

## 2019-05-05 MED ORDER — AMLODIPINE BESYLATE 10 MG PO TABS: 10.0000 mg | ORAL_TABLET | Freq: Every day | ORAL | 0 refills | Status: DC

## 2019-05-05 MED ORDER — PRAVASTATIN SODIUM 10 MG PO TABS: 20.0000 mg | ORAL_TABLET | Freq: Every day | ORAL | Status: DC

## 2019-05-05 MED ORDER — PREDNISONE 5 MG PO TABS: ORAL_TABLET | ORAL | 0 refills | Status: DC

## 2019-05-05 MED ORDER — APIXABAN 5 MG PO TABS: 5.0000 mg | ORAL_TABLET | Freq: Two times a day (BID) | ORAL | 0 refills | Status: DC

## 2019-05-05 MED ORDER — ATENOLOL 50 MG PO TABS
50.0000 mg | ORAL_TABLET | Freq: Every day | ORAL | Status: DC
  Administered 2019-05-05: 50 mg via ORAL
  Filled 2019-05-05 (×2): qty 1

## 2019-05-05 NOTE — Progress Notes (Signed)
OT Treatment Note  Completed education regarding use of AE for ADL. AE significantly decreases WOB during ADL. No further OT needs.    05/05/19 1000  OT Visit Information  Last OT Received On 05/05/19  Assistance Needed +1  History of Present Illness 53 y.o. female admitted on 04/30/19 for SOB due to COVID 19 PNA.  Pt also with significantly elevated d-dimer, negative DVT, but suspicious due to high d-dimer, so placed on heparin drip.  Pt with significant PMH of pre diabetes, HTN, arthritis.    Precautions  Precautions Other (comment)  Precaution Comments monitor O2  Cognition  Arousal/Alertness Awake/alert  Behavior During Therapy WFL for tasks assessed/performed  Overall Cognitive Status Within Functional Limits for tasks assessed  ADL  General ADL Comments Demonstrated use of AE to assist with LB ADL and pericare.  Pt unable to use wide sock aid due to width of her foot. Would need to use faric sock aid. Pt with decreased WOB with use of reacher, long handled sponge adn toilet aid.   Transfers  Overall transfer level Modified independent  General Comments  General comments (skin integrity, edema, etc.) comleting HEP with theraband independently  OT - End of Session  Activity Tolerance Patient tolerated treatment well  Patient left in chair;with call bell/phone within reach  Nurse Communication Mobility status  OT Assessment/Plan  OT Plan Discharge plan remains appropriate;All goals met and education completed, patient discharged from OT services  OT Visit Diagnosis Muscle weakness (generalized) (M62.81)  Follow Up Recommendations No OT follow up;Supervision - Intermittent  OT Equipment Tub/shower seat  AM-PAC OT "6 Clicks" Daily Activity Outcome Measure (Version 2)  Help from another person eating meals? 4  Help from another person taking care of personal grooming? 4  Help from another person toileting, which includes using toliet, bedpan, or urinal? 3  Help from another person  bathing (including washing, rinsing, drying)? 3  Help from another person to put on and taking off regular upper body clothing? 4  Help from another person to put on and taking off regular lower body clothing? 3  6 Click Score 21  OT Goal Progression  Progress towards OT goals Goals met/education completed, patient discharged from OT  Acute Rehab OT Goals  Patient Stated Goal to be able to go home by Monday  OT Goal Formulation With patient  OT Time Calculation  OT Start Time (ACUTE ONLY) 1000  OT Stop Time (ACUTE ONLY) 1020  OT Time Calculation (min) 20 min  OT General Charges  $OT Visit 1 Visit  OT Treatments  $Self Care/Home Management  8-22 mins  Maurie Boettcher, OT/L   Acute OT Clinical Specialist [place] Pager [phone] Office [phone]

## 2019-05-05 NOTE — Progress Notes (Signed)
Spoke with patient nurse regarding discontinue central catheter order.  IV RN educated RN that patient has a midline and is not a central catheter and to remove the midline as you would any other peripheral intravenous access (PIV).  Patient RN verbalized understanding.

## 2019-05-05 NOTE — TOC Progression Note (Addendum)
Transition of Care [facility]) - Progression Note    Patient Details  Name: Jannah Guardiola MRN: [id number] Date of Birth: 05/13/1966  [facility] CM/SW [place], [place] Phone Number: [phone] 05/05/2019, 1:38 PM  Clinical Narrative:     CSW consulted with Magda Paganini with [employer] to inform of patient's discharge today and need for home oxygen and 3in1, she reports this has already been set up in the patient's home.   CSW called nurse to identify any further needs. RN reports patient's mother is currently at the hospital to pick her up, oxygen tank delivered to room for transportation home.   No further discharge needs identified at this time.    Expected Discharge Plan: Home/Self Care Barriers to Discharge: No Barriers Identified  Expected Discharge Plan and Services Expected Discharge Plan: Home/Self Care   Discharge Planning Services: CM Consult Post Acute Care Choice: Durable Medical Equipment Living arrangements for the past 2 months: Single Family Home Expected Discharge Date: 05/05/19               DME Arranged: Oxygen   Date DME Agency Contacted: 05/03/19 Time DME Agency Contacted: 66 Representative spoke with at DME Agency: Learta Codding [facility] Arranged: NA           Social Determinants of Health ([place]) Interventions    Readmission Risk Interventions No flowsheet data found.

## 2019-05-05 NOTE — Progress Notes (Signed)
Occupational Therapy Evaluation Patient Details Name: Jamie Burton MRN: [id number] DOB: 1966/07/04 Today's Date: 05/05/2019    History of Present Illness 53 y.o. female admitted on 04/30/19 for SOB due to COVID 19 PNA.  Pt also with significantly elevated d-dimer, negative DVT, but suspicious due to high d-dimer, so placed on heparin drip.  Pt with significant PMH of pre diabetes, HTN, arthritis.     Clinical Impression   Pt demonstrates difficult with LB ADL and pericare due to body habitus and decreased activity tolerance. Pt with increased WOB durin gADL. Began educating pt on compensatory techniques and use of AE to help with ADL. Will return to complete education prior to DC. Pt will need bariatric shower seat for safe DC.     Follow Up Recommendations  No OT follow up;Supervision - Intermittent    Equipment Recommendations  Tub/shower seat(bariatric)    Recommendations for Other Services       Precautions / Restrictions Precautions Precautions: Other (comment) Precaution Comments: monitor O2      Mobility Bed Mobility               General bed mobility comments: Pt was OOB in the recliner chair.   Transfers Overall transfer level: Needs assistance Equipment used: Rolling walker (2 wheeled) Transfers: Sit to/from Stand Sit to Stand: Modified independent (Device/Increase time)              Balance Overall balance assessment: Mild deficits observed, not formally tested                                         ADL either performed or assessed with clinical judgement   ADL Overall ADL's : Needs assistance/impaired                                     Functional mobility during ADLs: Modified independent General ADL Comments: LB bathing and dressing are difficult in addition to pericare due to body habitus. Extra work on completing these ADL increase WOB and result in desat.      Vision         Perception     Praxis       Pertinent Vitals/Pain       Hand Dominance Right   Extremity/Trunk Assessment Upper Extremity Assessment Upper Extremity Assessment: Generalized weakness   Lower Extremity Assessment Lower Extremity Assessment: Defer to PT evaluation   Cervical / Trunk Assessment Cervical / Trunk Assessment: Normal   Communication Communication Communication: No difficulties   Cognition Arousal/Alertness: Awake/alert Behavior During Therapy: WFL for tasks assessed/performed Overall Cognitive Status: Within Functional Limits for tasks assessed                                     General Comments       Exercises     Shoulder Instructions      Home Living Family/patient expects to be discharged to:: Private residence Living Arrangements: Alone Available Help at Discharge: Family;Available PRN/intermittently Type of Home: House Home Access: Level entry     Home Layout: One level     Bathroom Shower/Tub: Chief Strategy OfficerTub/shower unit   Bathroom Toilet: Standard         Additional Comments: O2 and shower seat have been  delivered per pt      Prior Functioning/Environment Level of Independence: Independent        Comments: Pt works in Corporate treasurer at [employer]        OT Problem List: Obesity;Cardiopulmonary status limiting activity;Decreased knowledge of use of DME or AE;Decreased activity tolerance      OT Treatment/Interventions: Self-care/ADL training;DME and/or AE instruction;Energy conservation;Therapeutic activities;Patient/family education    OT Goals(Current goals can be found in the care plan section) Acute Rehab OT Goals Patient Stated Goal: to be able to go home by Monday OT Goal Formulation: With patient  OT Frequency: Min 2X/week   Barriers to D/C:            Co-evaluation              AM-PAC OT "6 Clicks" Daily Activity     Outcome Measure Help from another person eating meals?: None Help from another person taking care of personal  grooming?: None Help from another person toileting, which includes using toliet, bedpan, or urinal?: A Little Help from another person bathing (including washing, rinsing, drying)?: A Little Help from another person to put on and taking off regular upper body clothing?: None Help from another person to put on and taking off regular lower body clothing?: A Little 6 Click Score: 21   End of Session Nurse Communication: Mobility status;Other (comment)(DC needs)  Activity Tolerance: Patient tolerated treatment well Patient left: in chair;with call bell/phone within reach  OT Visit Diagnosis: Muscle weakness (generalized) (M62.81)                Time: 5409-8119 OT Time Calculation (min): 20 min Charges:  OT General Charges $OT Visit: 1 Visit OT Evaluation $OT Eval Moderate Complexity: [place], OT/L   Acute OT Clinical Specialist Acute Rehabilitation Services Pager [phone] Office [phone]   [facility] 05/05/2019, 10:05 AM

## 2019-05-05 NOTE — Discharge Summary (Signed)
Jamie Burton QPR:[id number] DOB: 1965-12-07 DOA: 04/30/2019  PCP: Caryl Bis, MD  Admit date: 04/30/2019  Discharge date: 05/05/2019  Admitted From: Home  Disposition:  Home   Recommendations for Outpatient Follow-up:   Follow up with PCP in 1-2 weeks  PCP Please obtain BMP/CBC, 2 view CXR in 1week,  (see Discharge instructions)   PCP Please follow up on the following pending results:    Home Health: PT,RN   Equipment/Devices: o2 3lit, 3 in 1  Consultations: PCCM Discharge Condition: Stable    CODE STATUS: Full    Diet Recommendation: Heart Healthy     CC - SOB   Brief history of present illness from the day of admission and additional interim summary    Jamie Burton a 53 y.o.BFPMHxOA, dyspnea, childhood heart murmur, HTN, HLD, prediabetes, morbid obesity,   [facility] for the past 3 days associated with fatigue, malaise, chills, night sweats, but no fever. She works at a [employer] home and tested positive on 04/26/2027, was diagnosed with COVID-19 pneumonitis with extremely high d-dimer and transferred here to ICU for further care.                                                                 Hospital Course    1.  Acute Hypoxic Resp. Failure due to Acute Covid 19 Viral Pneumonitis during the ongoing 2020 Covid 19 Pandemic - she was treated with IV steroids + Remdisvir along with full anticoagulation due to extremely high d-dimer.  Renal function precluded to CT angiogram.  Lower extremity venous duplex was not a high quality study but was negative.  Echocardiogram did not show any RV dysfunction.  At this time this line of treatment will be continued as d-dimer continues to be extremely high, she could have a lower extremity or  intra-abdominal clot which could explain her extremely high d-dimer, she has been treated with IV heparin which will be transitioned to oral Eliquis on 05/04/2019 as d-dimer finally is trending down.  She will be discharged on oral anticoagulation for at least 3-6 months with outpatient repeat ultrasound by PCP and repeat d-dimer checks in the outpatient setting until she stabilizes.  For her COVID-19 pneumonitis and possibility of PE  she will continue short course of oral steroid taper, he has finished her IV Remdisvir course today and has tolerated Eliquis for the last day, now relatively symptom-free at rest on 2-1/2 to 3 L nasal cannula oxygen, will be discharged home on Eliquis, oral prednisone taper along with nasal cannula oxygen with PCP and pulmonary follow-up.     COVID-19 Labs  Recent Labs    05/03/19 0302 05/04/19 0436 05/05/19 0441  DDIMER 19.29* 12.70* 9.90*  FERRITIN 246 175  --   LDH 375* 360*  --   CRP 1.5* 0.9  --     No results found for: SARSCOV2NAA  Hepatic Function Latest Ref Rng & Units 05/04/2019 05/03/2019 05/02/2019  Total Protein 6.5 - 8.1 g/dL 6.9 7.1 7.6  Albumin 3.5 - 5.0 g/dL 3.2(L) 3.2(L) 3.4(L)  AST 15 - 41 U/L 24 22 33  ALT 0 - 44 U/L 36 32 37  Alk Phosphatase 38 - 126 U/L 60 61 70  Total Bilirubin 0.3 - 1.2 mg/dL 0.4 0.4 0.6    2.  Extremely elevated d-dimer.  See above.    Follow outpatient with pulmonary for COVID and elevated d-dimer with possible underlying blood clot, in my opinion she is extremely high risk for DVT/PE.  Note lower extremity venous duplex was limited due to patient's obesity and hence inconclusive, she had AKI upon admission and due to her BMI of 80 CT angiogram chest was not done.  However her d-dimer remained around 20 for 3 days despite being on heparin drip and then started to trend down gradually on day 5 or so, clinical suspicion for a blood clot is very high.  She is morbidly obese and future risk for blood clots remain high.  For  now I would suggest at least 3 to 6 months of anticoagulation with close outpatient pulmonary and PCP follow-up to determine further goal of anticoagulation.   3.  Morbid obesity.  Follow with PCP for weight loss.  4.  Chronic diastolic dysfunction EF 62%.  Currently compensated.  5.  Hypertension. Stable on beta-blocker + Norvasc.  6.  AKI.  Now resolved after IV fluids and holding of ARB.  PCP to monitor.  Discharge diagnosis     Principal Problem:   Pneumonia due to COVID-19 virus Active Problems:   Hypertension   Hyperlipidemia   Elevated d-dimer    Discharge instructions    Discharge Instructions    Diet - low sodium heart healthy   Complete by: As directed    Discharge instructions   Complete by: As directed    Follow with Primary MD Caryl Bis, MD in 7 days   Get CBC, CMP, 2 view Chest X ray -  checked next visit within 1 week by Primary MD   Activity: As tolerated with Full fall precautions use walker/cane & assistance as needed  Disposition Home   Diet: Heart Healthy   Special Instructions: If you have smoked or chewed Tobacco  in the last 2 yrs please stop smoking, stop any regular Alcohol  and or any Recreational drug use.  On your next visit with your primary care physician please Get Medicines reviewed and adjusted.  Please request your Prim.MD to go over all Hospital Tests and Procedure/Radiological results at the follow up, please get all Hospital records sent to your Prim MD by signing hospital release before you go home.  If you experience worsening of your admission symptoms, develop shortness of breath, life threatening emergency, suicidal or homicidal  thoughts you must seek medical attention immediately by calling 911 or calling your MD immediately  if symptoms less severe.  You Must read complete instructions/literature along with all the possible adverse reactions/side effects for all the Medicines you take and that have been prescribed to  you. Take any new Medicines after you have completely understood and accpet all the possible adverse reactions/side effects.                                       untitled image                                    [facility]                            [place], [place] [address]     Jamie Burton was admitted to the Hospital on 04/30/2019 and Discharged  05/05/2019 and should be excused from work/school   for 21  days starting from date -  04/30/2019 , may return to work/school without any restrictions.  Call Lala Lund MD, [facility]  [phone] with questions.  Lala Lund M.D on 05/05/2019,at 8:42 AM  [facility]   Office  [phone]   Increase activity slowly   Complete by: As directed       Discharge Medications   Allergies as of 05/05/2019   No Known Allergies     Medication List    STOP taking these medications   ibuprofen 200 MG tablet Commonly known as: ADVIL     TAKE these medications   albuterol 108 (90 Base) MCG/ACT inhaler Commonly known as: VENTOLIN HFA Inhale 2 puffs into the lungs every 6 (six) hours as needed for wheezing or shortness of breath.   amLODipine 10 MG tablet Commonly known as: NORVASC Take 1 tablet (10 mg total) by mouth daily.   apixaban 5 MG Tabs tablet Commonly known as: ELIQUIS Take 2 tablets (10 mg total) by mouth 2 (two) times daily for 6 days.   apixaban 5 MG Tabs tablet Commonly known as: ELIQUIS Take 1 tablet (5 mg total) by mouth 2 (two) times daily. Start taking on: May 12, 2019   atenolol 50 MG tablet Commonly known as: TENORMIN Take 50 mg by mouth daily.   hydrochlorothiazide 25 MG tablet Commonly known as: HYDRODIURIL Take 25 mg by mouth daily.   lovastatin 20 MG tablet Commonly known as: MEVACOR Take 20 mg by mouth 2 (two) times daily.   predniSONE 5 MG tablet Commonly known as: DELTASONE Label  & dispense according  to the schedule below. take 8 Pills PO for 3 days, 6 Pills PO for 3 days, 4 Pills PO for 3 days, 2 Pills PO for 3 days, 1 Pills PO for 3 days, 1/2 Pill  PO for 3 days then STOP. Total 65 pills.            Durable Medical Equipment  (From admission, onward)         Start     Ordered   05/03/19 1542  For home use only DME 3 n 1  Once    Comments: Bariatric 3 in 1   05/03/19 1541   05/03/19 1019  For home use only DME oxygen  Once    Question Answer Comment  Length of Need 6 Months   Mode or (Route) Nasal cannula   Liters per Minute 2   Frequency Continuous (stationary and portable oxygen unit needed)   Oxygen conserving device Yes   Oxygen delivery system Gas      05/03/19 1018          Follow-up Information    Caryl Bis, MD. Schedule an appointment as soon as possible for a visit in 1 week(s).   Specialty: Family Medicine Contact information: [place] [address] [phone]        Brand Males, MD. Schedule an appointment as soon as possible for a visit in 2 week(s).   Specialty: Pulmonary Disease Why: Covid, ? PE Contact information: [place] [address] [phone]           Major procedures and Radiology Reports - PLEASE review detailed and final reports thoroughly  -        Dg Chest Port 1 View  Result Date: 05/01/2019 CLINICAL DATA:  Pneumonia. EXAM: PORTABLE CHEST 1 VIEW COMPARISON:  One-view chest x-ray 04/30/2019 at [facility] [place]. FINDINGS: Heart is enlarged. Lung volumes are low. Progressive bibasilar airspace opacities are present. There is no edema or effusion. IMPRESSION: 1. Slight progression of bibasilar airspace opacities compatible with COVID-19 pneumonia. 2. Cardiomegaly without failure. 3. Low lung volumes. Electronically Signed   By: San Morelle M.D.   On: 05/01/2019 08:54   Vas [place] Lower Extremity Venous (dvt)  Result Date: 05/01/2019  Lower Venous Study Indications: Swelling, and  elevated dimer. Other Indications: COVID 19. Anticoagulation: Lovenox. Comparison Study: Patient had a Lower extremity venous performed at another                   facility on 03/30/19 the results were inconclusive. The exam                   performed today only the Common Femoral proximal and mid were                   adequately visualized with no evidence of DVT in these areas.                   Very limited exam due to patient habitus Performing Technologist: Darlina Sicilian RDCS  Examination Guidelines: A complete evaluation includes B-mode imaging, spectral Doppler, color Doppler, and power Doppler as needed of all accessible portions of each vessel. Bilateral testing is considered an integral part of a complete examination. Limited examinations for reoccurring indications may be performed as noted.  +---------+---------------+---------+-----------+----------+--------------+  RIGHT     Compressibility Phasicity Spontaneity Properties Thrombus Aging  +---------+---------------+---------+-----------+----------+--------------+  CFV       Full                                                             +---------+---------------+---------+-----------+----------+--------------+  SFJ       Full                                                             +---------+---------------+---------+-----------+----------+--------------+  FV Prox   Full                                                             +---------+---------------+---------+-----------+----------+--------------+  FV Mid    Full                                                             +---------+---------------+---------+-----------+----------+--------------+  FV Distal                                                  Not visualized  +---------+---------------+---------+-----------+----------+--------------+  POP                                                        Not visualized   +---------+---------------+---------+-----------+----------+--------------+  PTV       Full                                                             +---------+---------------+---------+-----------+----------+--------------+  Soleal                                                     Not visualized  +---------+---------------+---------+-----------+----------+--------------+   +---------+---------------+---------+-----------+----------+-------------------+  LEFT      Compressibility Phasicity Spontaneity Properties Thrombus Aging       +---------+---------------+---------+-----------+----------+-------------------+  CFV       Full            Yes       Yes                                         +---------+---------------+---------+-----------+----------+-------------------+  SFJ       Full                                                                  +---------+---------------+---------+-----------+----------+-------------------+  FV Prox   Full                                                                  +---------+---------------+---------+-----------+----------+-------------------+  FV Distal                                                  able to visualize                                                                in long axis with                                                                color flow due to                                                                the depth of the                                                                 vein unable to                                                                   compress             +---------+---------------+---------+-----------+----------+-------------------+  PTV       Full                                                                   +---------+---------------+---------+-----------+----------+-------------------+  PERO                                                       Not visualized       +---------+---------------+---------+-----------+----------+-------------------+    Summary: Right: Portions of this examination were limited- see technologist comments above. There is no evidence of deep vein thrombosis in the lower extremity. Left: Portions of this examination were limited- see technologist comments above. There is no evidence of deep vein thrombosis in the lower extremity.  *See table(s) above for measurements and  Not visualized       +---------+---------------+---------+-----------+----------+-------------------+    Summary: Right: Portions of this examination were limited- see technologist comments above. There is no evidence of deep vein thrombosis in the lower extremity. Left: Portions of this examination were limited- see technologist comments above. There is no evidence of deep vein thrombosis in the lower extremity.  *See table(s) above for measurements and observations. Electronically signed by Curt Jews MD on 05/01/2019 at 4:38:48 PM.    Final     Micro Results     Recent Results (from the past 240 hour(s))  MRSA PCR Screening     Status: None   Collection Time: 04/30/19 11:05 PM   Specimen: Nasopharyngeal  Result Value Ref Range Status   MRSA by PCR NEGATIVE NEGATIVE Final    Comment:        The GeneXpert MRSA Assay (FDA approved for NASAL specimens only), is one component of a comprehensive MRSA colonization surveillance program. It is not intended to diagnose MRSA infection nor to guide or monitor treatment for MRSA infections. Performed at [facility], [place] [address], [place], [place] [address]     Today   Subjective    Jamie Burton today has no headache,no chest abdominal pain,no new weakness tingling or numbness, feels much better wants to go home today.     Objective   Blood pressure 136/89, pulse (!) 59, temperature 97.7 F (36.5 C), temperature source Oral, resp. Rate 19, height 5' 5" (1.651 m), weight (!) 217.7 kg, SpO2 96 %.   Intake/Output Summary (Last 24 hours) at 05/05/2019 0843 Last data filed at 05/05/2019 0600 Gross per 24 hour  Intake 719.44 ml  Output 350 ml  Net 369.44 ml    Exam  Awake Alert, Oriented x 3, No new F.N deficits, Normal affect [place].AT,PERRAL Supple Neck,No JVD, No cervical  lymphadenopathy appriciated.  Symmetrical Chest wall movement, Good air movement bilaterally, CTAB RRR,No Gallops,Rubs or new Murmurs, No Parasternal Heave +ve B.Sounds, Abd Soft, Non tender, No organomegaly appriciated, No rebound -guarding or rigidity. No Cyanosis, Clubbing or edema, No new Rash or bruise   Data Review   CBC w Diff:  Lab Results  Component Value Date   WBC 13.5 (H) 05/04/2019   HGB 12.4 05/04/2019   HCT 40.7 05/04/2019   PLT 185 05/04/2019   LYMPHOPCT 8 05/04/2019   MONOPCT 6 05/04/2019   EOSPCT 0 05/04/2019   BASOPCT 0 05/04/2019    CMP:  Lab Results  Component Value Date   NA 140 05/04/2019   K 4.4 05/04/2019   CL 105 05/04/2019   CO2 23 05/04/2019   BUN 28 (H) 05/04/2019   CREATININE 0.83 05/04/2019   PROT 6.9 05/04/2019   ALBUMIN 3.2 (L) 05/04/2019   BILITOT 0.4 05/04/2019   ALKPHOS 60 05/04/2019   AST 24 05/04/2019   ALT 36 05/04/2019  .   Total Time in preparing paper work, data evaluation and todays exam - 42 minutes  Lala Lund M.D on 05/05/2019 at 8:43 AM  [facility]   Office  [phone]

## 2019-05-05 NOTE — Plan of Care (Signed)
  Problem: Respiratory: Goal: Will maintain a patent airway Outcome: Adequate for Discharge Goal: Complications related to the disease process, condition or treatment will be avoided or minimized Outcome: Adequate for Discharge   Problem: Education: Goal: Knowledge of General Education information will improve Description: Including pain rating scale, medication(s)/side effects and non-pharmacologic comfort measures Outcome: Adequate for Discharge   Problem: Health Behavior/Discharge Planning: Goal: Ability to manage health-related needs will improve Outcome: Adequate for Discharge   Problem: Clinical Measurements: Goal: Ability to maintain clinical measurements within normal limits will improve Outcome: Adequate for Discharge Goal: Will remain free from infection Outcome: Adequate for Discharge Goal: Diagnostic test results will improve Outcome: Adequate for Discharge Goal: Respiratory complications will improve Outcome: Adequate for Discharge Goal: Cardiovascular complication will be avoided Outcome: Adequate for Discharge   Problem: Activity: Goal: Risk for activity intolerance will decrease Outcome: Adequate for Discharge   Problem: Nutrition: Goal: Adequate nutrition will be maintained Outcome: Adequate for Discharge   Problem: Coping: Goal: Level of anxiety will decrease Outcome: Adequate for Discharge   Problem: Elimination: Goal: Will not experience complications related to bowel motility Outcome: Adequate for Discharge Goal: Will not experience complications related to urinary retention Outcome: Adequate for Discharge   Problem: Pain Managment: Goal: General experience of comfort will improve Outcome: Adequate for Discharge   Problem: Safety: Goal: Ability to remain free from injury will improve Outcome: Adequate for Discharge   Problem: Skin Integrity: Goal: Risk for impaired skin integrity will decrease Outcome: Adequate for Discharge

## 2019-05-07 ENCOUNTER — Encounter: Payer: Self-pay | Admitting: Internal Medicine

## 2019-05-07 LAB — GLUCOSE, CAPILLARY
Glucose-Capillary: 145 mg/dL — ABNORMAL HIGH (ref 70–99)
Glucose-Capillary: 205 mg/dL — ABNORMAL HIGH (ref 70–99)

## 2019-05-27 ENCOUNTER — Telehealth: Payer: Self-pay | Admitting: Internal Medicine

## 2019-05-27 ENCOUNTER — Ambulatory Visit (INDEPENDENT_AMBULATORY_CARE_PROVIDER_SITE_OTHER): Payer: 59

## 2019-05-27 ENCOUNTER — Other Ambulatory Visit: Payer: Self-pay

## 2019-05-27 ENCOUNTER — Ambulatory Visit (INDEPENDENT_AMBULATORY_CARE_PROVIDER_SITE_OTHER): Payer: No Typology Code available for payment source | Admitting: Internal Medicine

## 2019-05-27 VITALS — BP 128/72 | HR 85 | Ht 64.0 in | Wt >= 6400 oz

## 2019-05-27 DIAGNOSIS — R5381 Other malaise: Secondary | ICD-10-CM | POA: Diagnosis not present

## 2019-05-27 DIAGNOSIS — Z560 Unemployment, unspecified: Secondary | ICD-10-CM

## 2019-05-27 DIAGNOSIS — J1282 Pneumonia due to coronavirus disease 2019: Secondary | ICD-10-CM

## 2019-05-27 DIAGNOSIS — Z23 Encounter for immunization: Secondary | ICD-10-CM

## 2019-05-27 DIAGNOSIS — J1289 Other viral pneumonia: Secondary | ICD-10-CM

## 2019-05-27 DIAGNOSIS — U071 COVID-19: Secondary | ICD-10-CM | POA: Diagnosis not present

## 2019-05-27 DIAGNOSIS — Z87898 Personal history of other specified conditions: Secondary | ICD-10-CM

## 2019-05-27 DIAGNOSIS — R7989 Other specified abnormal findings of blood chemistry: Secondary | ICD-10-CM | POA: Diagnosis not present

## 2019-05-27 LAB — D-DIMER, QUANTITATIVE: D-Dimer, Quant: 1.11 mcg/mL FEU — ABNORMAL HIGH (ref <0.50)

## 2019-05-27 NOTE — Telephone Encounter (Signed)
Spoke with pt, requesting to have today's OV notes faxed to her PCP.  This has been done.  Nothing further needed at this time- will close encounter.

## 2019-05-27 NOTE — Telephone Encounter (Signed)
covid-19 infiltrates have resolved in CXR - good news  D-dimer improved from > 20 to 1+ -> good news but needs to continue anticoagulation  All plans per OV

## 2019-05-27 NOTE — Progress Notes (Signed)
OV 05/27/2019  Subjective:  Patient ID: Jamie Burton, female , DOB: [birth year] , age 53 y.o. , MRN: [id number] , ADDRESS: [place] [address]   05/27/2019 -   Chief Complaint  Patient presents with  . Consult    hospital follow-up.  pt treated for covid, pna.  pt has good and bad days, still experiencing sob with exertion.      HPI Jamie Burton 53 y.o. -presents to the pulmonary clinic for new evaluation.  There is a post COVID 84 survivor.  Review of the chart indicates that she was admitted early September 2020.  At this point in time she is over 21 days since her first symptom.  She was admitted for 5 or 6 days at [facility].  I discussed with the hospitalist Dr. Candiss Norse.  He clearly remembers the case.  He says she was extremely hypoxemic on arrival and had an extremely high d-dimer.  She was treated with Decadron and REMdesivir.  He said that because the D-dimers was extremely high pulmonary embolism was suspected but because of either renal function or CT scan unavailability in the duplex legs being of poor quality a pulmonary embolism was presumed and she was started on IV heparin full dose anticoagulation.  The next day she improved remarkably with her hypoxemia making them for the suspect pulmonary embolism.  She is then been discharged on 6 months to 3 months of Eliquis.  Patient is here for follow-up.  At this point in time she tells me that she is much better but she still has exertional dyspnea.  She also has severe exertional fatigue no cough.  Walking desaturation test 185 feet x 3 laps on room air: She was only able to complete 1 lap and even for this she had to stop 6 times.  She did not desaturate.  She says sometimes at home she desaturates.  She has extreme tiredness.  Since her last visit she saw her primary care physician.  I reviewed those notes.  CBC and chemistries are normal.  Her most recent chest x-ray at the time of discharge showed  some pulmonary infiltrates.   Her echo from the hospital is also reviewed.  She is here with the case manager and they had multiple questions.  All of which was answered.     ROS - per HPI  Results for [name], [name] (MRN [id number]) as of 05/27/2019 11:01  Ref. Range 04/30/2019 23:30 05/01/2019 08:55 05/02/2019 06:33 05/03/2019 03:02 05/04/2019 04:36 05/05/2019 04:41  D-Dimer, Quant Latest Ref Range: 0.00 - 0.50 ug/mL-FEU >20.00 (H) >20.00 (H) >20.00 (H) 19.29 (H) 12.70 (H) 9.90 (H)     ECHO 05/01/2019 1. The left ventricle has hyperdynamic systolic function, with an ejection fraction of >65%. The cavity size was normal. There is moderately increased left ventricular wall thickness. No evidence of left ventricular regional wall motion abnormalities.  2. The right ventricle has normal systolc function. The cavity was normal. There is no increase in right ventricular wall thickness. Right ventricular systolic pressure is normal with an estimated pressure of 36.0 mmHg.  3. The aortic valve was not well visualized.  4. Pulmonic valve regurgitation was not assessed by color flow Doppler.  FINDINGS  Left Ventricle: The left ventricle has hyperdynamic systolic function, with an ejection fraction of >65%. The cavity size was normal. There is moderately increased left ventricular wall thickness. No evidence of left ventricular regional wall motion  abnormalities.  Right Ventricle: The right ventricle has normal systolic function. The cavity was normal. There is no increase in right ventricular wall thickness. Right ventricular systolic pressure is normal with an estimated pressure of 36.0 mmHg.   has a past medical history of Arthritis, Dyspnea, Heart murmur, Hyperlipidemia, Hypertension, Pneumonia due to COVID-19 virus (04/30/2019), and Pre-diabetes.   reports that she has never smoked. She has never used smokeless tobacco.  No past surgical history on file.  No Known Allergies   There is no  immunization history on file for this patient.  Family History  Problem Relation Age of Onset  . Hypertension Other      Current Outpatient Medications:  .  albuterol (VENTOLIN HFA) 108 (90 Base) MCG/ACT inhaler, Inhale 2 puffs into the lungs every 6 (six) hours as needed for wheezing or shortness of breath., Disp: 6.7 g, Rfl: 0 .  amLODipine (NORVASC) 10 MG tablet, Take 1 tablet (10 mg total) by mouth daily., Disp: 30 tablet, Rfl: 0 .  apixaban (ELIQUIS) 5 MG TABS tablet, Take 1 tablet (5 mg total) by mouth 2 (two) times daily., Disp: 60 tablet, Rfl: 0 .  atenolol (TENORMIN) 50 MG tablet, Take 50 mg by mouth daily., Disp: , Rfl:  .  hydrochlorothiazide (HYDRODIURIL) 25 MG tablet, Take 25 mg by mouth daily., Disp: , Rfl:  .  lovastatin (MEVACOR) 20 MG tablet, Take 20 mg by mouth 2 (two) times daily., Disp: , Rfl:       Objective:   Vitals:   05/27/19 1041  BP: 128/72  Pulse: 85  SpO2: 95%  Weight: (!) 406 lb (184.2 kg)  Height: 5\' 4"  (1.626 m)    Estimated body mass index is 69.69 kg/m as calculated from the following:   Height as of this encounter: 5\' 4"  (1.626 m).   Weight as of this encounter: 406 lb (184.2 kg).  @WEIGHTCHANGE @    05/27/19 1041  Weight: (!) 406 lb (184.2 kg)     Physical Exam  General Appearance:    Alert, cooperative, no distress, appears stated age - yes , Deconditioned looking - no , OBESE  - YES, Sitting on Wheelchair -  no  Head:    Normocephalic, without obvious abnormality, atraumatic  Eyes:    PERRL, conjunctiva/corneas clear,  Ears:    Normal TM's and external ear canals, both ears  Nose:   Nares normal, septum midline, mucosa normal, no drainage    or sinus tenderness. OXYGEN ON  - no . Patient is @ ra   Throat: MASK  Neck:   Supple, symmetrical, trachea midline, no adenopathy;    thyroid:  no enlargement/tenderness/nodules; no carotid   bruit or JVD  Back:     Symmetric, no curvature, ROM normal, no CVA tenderness   Lungs:     Distress - no , Wheeze no, Barrell Chest - no, Purse lip breathing - no, Crackles - no   Chest Wall:    No tenderness or deformity.    Heart:    Regular rate and rhythm, S1 and S2 normal, no rub   or gallop, Murmur - no  Breast Exam:    NOT DONE  Abdomen:     Soft, non-tender, bowel sounds active all four quadrants,    no masses, no organomegaly. Visceral obesity - yes  Genitalia:   NOT DONE  Rectal:   NOT DONE  Extremities:   Extremities - normal, Has Cane - no, Clubbing - no, Edema - no  Pulses:  2+ and symmetric all extremities  Skin:   Stigmata of Connective Tissue Disease - no  Lymph nodes:   Cervical, supraclavicular, and axillary nodes normal  Psychiatric:  Neurologic:   Pleasant - yes, Anxious - no, Flat affect - no  CAm-ICU - neg, Alert and Oriented x 3 - yes, Moves all 4s - yes, Speech - normal, Cognition - intact           Assessment:       ICD-10-CM   1. Pneumonia due to COVID-19 virus  U07.1 DG Chest 2 View   J12.89 AMB referral to pulmonary rehabilitation    Amb Referral to Clinical Pharmacist  2. Physical deconditioning  R53.81 AMB referral to pulmonary rehabilitation    Amb Referral to Clinical Pharmacist  3. Elevated d-dimer  R79.89 D-Dimer, Quantitative    Amb Referral to Clinical Pharmacist  4. History of snoring  Z87.898   5. Morbid obesity (HCC)  E66.01   6. Out of work  Z56.0        Plan:     Patient Instructions  Pneumonia due to COVID-19 virus Physical Deconditioning causing shortness of breath - glad you are better but still having residual effects of shortness of breath  - walking desaturation test 05/27/2019  - did not desaturate but you stoopped due to deconditioning  plan - get CXR 05/27/2019 - flu shot 05/27/2019  - refer pulmonary rehab - if unimproved can repeat echo   Elevated d-dimer and presumed PE at hospital (CT chest was not possible at hospital) - directly related to COVID-19  - recheck d-dimer 05/27/2019   -  continue anticoagulation for 3-6 months because of PE presumption clinically at the time of admit (They could not get CT chest due to machine issue)        - eliqiuis (preferred) or xarelto coupons and samples    History of snoring Morbid obesity (HCC)  - refer Dr Aldean Astlalare first available  Out of work  - recommend you be out of work for 3 months till we sort out above and get you better  Followup  - 2 months or sooner if needed   > 50% of this > 40 min visit spent in face to face counseling or/and coordination of care - by this undersigned MD - Dr Kalman ShanMurali Gotti Alwin. This includes one or more of the following documented above: discussion of test results, diagnostic or treatment recommendations, prognosis, risks and benefits of management options, instructions, education, compliance or risk-factor reduction   SIGNATURE    Dr. Kalman ShanMurali Lysandra Loughmiller, M.D., F.C.C.P,  Pulmonary and Critical Care Medicine Staff Physician, [facility] Center Director - Interstitial Lung Disease  Program  Pulmonary Fibrosis [facility] [place], [place], [id number]  Pager: [phone], If no answer or between  15:00h - 7:00h: call [phone] Telephone: [phone]  11:43 AM 05/27/2019

## 2019-05-27 NOTE — Patient Instructions (Addendum)
Pneumonia due to COVID-19 virus Physical Deconditioning causing shortness of breath - glad you are better but still having residual effects of shortness of breath  - walking desaturation test 05/27/2019  - did not desaturate but you stoopped due to deconditioning  plan - get CXR 05/27/2019 - flu shot 05/27/2019  - refer pulmonary rehab - if unimproved can repeat echo   Elevated d-dimer and presumed PE at hospital (CT chest was not possible at hospital) - directly related to COVID-19  - recheck d-dimer 05/27/2019   - continue anticoagulation for 3-6 months because of PE presumption clinically at the time of admit (They could not get CT chest due to machine issue)        - eliqiuis (preferred) or xarelto coupons and samples    History of snoring Morbid obesity ([place])  - refer Dr Gala Murdoch first available  Out of work  - recommend you be out of work for 3 months till we sort out above and get you better  Followup  - 2 months or sooner if needed

## 2019-05-28 ENCOUNTER — Other Ambulatory Visit: Payer: Self-pay

## 2019-05-28 DIAGNOSIS — Z20822 Contact with and (suspected) exposure to covid-19: Secondary | ICD-10-CM

## 2019-05-28 NOTE — Telephone Encounter (Signed)
Spoke with pt, aware of results/recs.  Nothing further needed.  

## 2019-05-29 LAB — NOVEL CORONAVIRUS, NAA: SARS-CoV-2, NAA: NOT DETECTED

## 2019-06-03 ENCOUNTER — Telehealth: Payer: Self-pay | Admitting: Internal Medicine

## 2019-06-07 NOTE — Telephone Encounter (Addendum)
Spoke with pharmacist Estill Bamberg and she is not sure why patient wants the Rx to be filled under workmans comp because she has been getting filled. I called pt and she states she was put on Eliquis at the hospital because she contracted Covid while on the job. Estill Bamberg said she would look into this and call me back with an update. Will await return call.

## 2019-06-10 ENCOUNTER — Telehealth: Payer: Self-pay | Admitting: Internal Medicine

## 2019-06-10 NOTE — Telephone Encounter (Signed)
Attempted to call Jamie Burton at [facility], she was out of the office but will be back this afternoon. Other staff members did not have an update to give [place]. Left a message for Jamie Burton to call us back.

## 2019-06-11 ENCOUNTER — Telehealth: Payer: Self-pay | Admitting: Internal Medicine

## 2019-06-11 NOTE — Telephone Encounter (Signed)
Called [place] with [facility] and was advised they have not been able to run the Eliquis through workers comp but it can go through insurance with a $0 copay. Called and spoke to pt. Pt states she has been in contact with her adjuster through work and they are handling the script to be sure it goes through pts workers comp. Pt is aware to contact us back if there are any issues getting the script. Nothing further needed at this time.

## 2019-06-11 NOTE — Telephone Encounter (Signed)
Information printed. Faxed to [phone] at patient request.

## 2019-06-14 NOTE — Telephone Encounter (Signed)
Spoke with Vern at [employer] and advised her that I didn't receive any FMLA forms. She is going to fax them to me again on Monday. I will await fax.

## 2019-06-16 NOTE — Progress Notes (Signed)
Pharmacy Note  Subjective:  Patient referred by Dr. Chase Caller for medication education. PMH significant for HTN, HLD, and hx of COVID-19 (2020).  Patient recently hospitalized from 04/30/2019 to 05/07/2019 for acute hypoxic respiratory failure due to Acute COVID-19 Viral Pneumonitis. [facility] the past 3 days associated with fatigue, malaise, chills, night sweats, but no fever. She works at a [employer] home and tested positive on 04/26/2027, was diagnosed with COVID-19 pneumonitis with extremely high d-dimer and transferred to [facility] ICU for further care. She was treated with IV steroids + Remdisvir along with full anticoagulation due to extremely high d-dimer. Renal function precluded to CT angiogram. Lower extremity venous duplex was not a high quality study but was negative. Echocardiogram did not show any RV dysfunction. She was discharged on Eliquis, oral prednisone taper, and nasal cannula oxygen.   Pt recently seen by Dr. Chase Caller after hospital discharge on 05/27/19. Pt performed a walking desaturation test, which showed she did not desaturate, however, was experiencing deconditioning. There were no medication changes.   Pt presents today for initial appt. She states she was told by Dr. Chase Caller she was referred to pharmacy team for medication counseling on albuterol inhaler. She states she was recently started on apixaban and amlodipine and would like to be counseled on these medications as well.   Prescription insurance: [employer]  Current pulm meds: albuterol (Ventolin) prn Prior pulm meds: none  Social history: No alcohol or tobacco use  Objective: EXAM (05/27/19): CHEST - 2 VIEW --COMPARISON:  Chest radiograph 05/01/2019 --FINDINGS: Stable mediastinal contours. Heart size is normal. Decreased previously seen bibasilar opacities. No new focal opacity. No pneumothorax or pleural effusion.  Multilevel degenerative changes in the thoracic spine. --IMPRESSION: No evidence of active disease. Resolution of the previously seen bibasilar pulmonary opacities.  Results for ANTHEA, UDOVICH (MRN [id number]) as of 06/16/2019 21:47  Ref. Range 05/02/2019 06:33 05/03/2019 03:02 05/04/2019 04:36 05/05/2019 04:41 05/27/2019 11:53  D-Dimer, America Brown Latest Ref Range: <0.50 mcg/mL FEU >20.00 (H) 19.29 (H) 12.70 (H) 9.90 (H) 1.11 (H)    Current Outpatient Medications on File Prior to Visit  Medication Sig Dispense Refill  . albuterol (VENTOLIN HFA) 108 (90 Base) MCG/ACT inhaler Inhale 2 puffs into the lungs every 6 (six) hours as needed for wheezing or shortness of breath. 6.7 g 0  . amLODipine (NORVASC) 10 MG tablet Take 1 tablet (10 mg total) by mouth daily. 30 tablet 0  . apixaban (ELIQUIS) 5 MG TABS tablet Take 1 tablet (5 mg total) by mouth 2 (two) times daily. 60 tablet 0  . atenolol (TENORMIN) 50 MG tablet Take 50 mg by mouth daily.    . hydrochlorothiazide (HYDRODIURIL) 25 MG tablet Take 25 mg by mouth daily.    Marland Kitchen lovastatin (MEVACOR) 20 MG tablet Take 20 mg by mouth 2 (two) times daily.     No current facility-administered medications on file prior to visit.      Assessment/Plan: 1. Inhaler education: albuterol prn inhaler Patient was counseled on the purpose, proper use, and adverse effects of albuterol prn inhaler. Patient verbalized understanding.  Reviewed appropriate use of maintenance vs rescue inhalers.  Stressed importance of using rescue inhaler only as needed.  Patient verbalized understanding.  Demonstrated proper inhaler technique using albuterol demo inhaler.  Patient able to demonstrate proper inhaler technique using teach back method.   Albuterol handout was provided to pt.  2. Medication counseling: apixaban, amlodipine Educated pt about mechanism of  action, side effects (bruising, bleeding), precautions (f/u with ED if pt experiences prolonged bleeding or hits her head),  and efficacy of apixaban. Educated pt about mechanism of action, side effects (edema), precautions (orhtostasis), and efficacy of amlodipine. Pt verbalized understanding.   Thank you for involving pharmacy to assist in providing Ms. Gladwell's care.   Zachery Conch, PharmD PGY2 Ambulatory Care Pharmacy Resident  I evaluated the patient with Zachery Conch, PharmD. I am in agreement with the plan of care discussed and noted above.  Verlin Fester, PharmD, [place], CPP Clinical Specialty Pharmacist [phone]  06/17/2019 1:13 PM

## 2019-06-16 NOTE — Patient Instructions (Addendum)
It was a pleasure seeing you in clinic today [name]!  Today the plan is...  1.Continue to use albuterol inhaler 2 inhalations every 6 hours as needed. Refer to the handout if you need a reference regarding how to use inhaler.   Please call the PharmD clinic at [phone] if you have any questions that you would like to speak with a pharmacist about Jamie Burton, Museum/gallery conservator).

## 2019-06-17 ENCOUNTER — Other Ambulatory Visit: Payer: Self-pay

## 2019-06-17 ENCOUNTER — Ambulatory Visit (INDEPENDENT_AMBULATORY_CARE_PROVIDER_SITE_OTHER): Payer: No Typology Code available for payment source | Admitting: Pharmacist

## 2019-06-17 DIAGNOSIS — Z79899 Other long term (current) drug therapy: Secondary | ICD-10-CM

## 2019-06-17 NOTE — Telephone Encounter (Signed)
Forms not in SG box today. Will await forms.

## 2019-06-20 ENCOUNTER — Encounter: Payer: Self-pay | Admitting: Pulmonary Disease

## 2019-06-20 ENCOUNTER — Institutional Professional Consult (permissible substitution): Payer: 59 | Admitting: Pulmonary Disease

## 2019-06-20 ENCOUNTER — Other Ambulatory Visit: Payer: Self-pay

## 2019-06-20 ENCOUNTER — Ambulatory Visit (INDEPENDENT_AMBULATORY_CARE_PROVIDER_SITE_OTHER): Payer: 59 | Admitting: Pulmonary Disease

## 2019-06-20 VITALS — BP 128/80 | HR 67 | Ht 64.0 in | Wt >= 6400 oz

## 2019-06-20 DIAGNOSIS — R0683 Snoring: Secondary | ICD-10-CM | POA: Diagnosis not present

## 2019-06-20 NOTE — Progress Notes (Signed)
Subjective:    Patient ID: Jamie Burton, female    DOB: 1966/03/29, 53 y.o.   MRN: [id number]  Patient being evaluated for possible obstructive sleep  History of snoring No witnessed apneas Thyroid time Denies dryness of the mouth in the mornings Denies headaches Memory is good  Admits to sweating at night No palpitations No choking episodes  Usually goes to bed between 9 PM and 11 PM Takes about 30 minutes for an hour to fall asleep Final awakening time about 6:30 AM Wakes up 2-3 times during the night-nonspecific reasons  No family history of obstructive sleep apnea  Past Medical History:  Diagnosis Date  . Arthritis   . Dyspnea   . Heart murmur    told she had a heart murmur as a child but unsure now  . Hyperlipidemia   . Hypertension   . Pneumonia due to COVID-19 virus 04/30/2019  . Pre-diabetes    Social History   Socioeconomic History  . Marital status: Single    Spouse name: Not on file  . Number of children: 1  . Years of education: Not on file  . Highest education level: Not on file  Occupational History  . Not on file  Social Needs  . Financial resource strain: Not hard at all  . Food insecurity    Worry: Never true    Inability: Never true  . Transportation needs    Medical: No    Non-medical: No  Tobacco Use  . Smoking status: Never Smoker  . Smokeless tobacco: Never Used  Substance and Sexual Activity  . Alcohol use: Never    Frequency: Never  . Drug use: Never  . Sexual activity: Not Currently    Birth control/protection: I.U.D.  Lifestyle  . Physical activity    Days per week: 0 days    Minutes per session: 0 min  . Stress: Not at all  Relationships  . Social connections    Talks on phone: More than three times a week    Gets together: More than three times a week    Attends religious service: More than 4 times per year    Active member of club or organization: Yes    Attends meetings of clubs or organizations: More than 4  times per year    Relationship status: Not on file  . Intimate partner violence    Fear of current or ex partner: Not on file    Emotionally abused: Not on file    Physically abused: Not on file    Forced sexual activity: Not on file  Other Topics Concern  . Not on file  Social History Narrative  . Not on file   Family History  Problem Relation Age of Onset  . Hypertension Other      Review of Systems  Constitutional: Negative for fever and unexpected weight change.  HENT: Negative for congestion, dental problem, ear pain, nosebleeds, postnasal drip, rhinorrhea, sinus pressure, sneezing, sore throat and trouble swallowing.   Eyes: Negative for redness and itching.  Respiratory: Positive for shortness of breath. Negative for cough, chest tightness and wheezing.   Cardiovascular: Positive for leg swelling. Negative for palpitations.  Gastrointestinal: Negative for nausea and vomiting.  Genitourinary: Negative for dysuria.  Musculoskeletal: Negative for joint swelling.  Skin: Negative for rash.  Allergic/Immunologic: Negative.  Negative for environmental allergies, food allergies and immunocompromised state.  Neurological: Negative for headaches.  Hematological: Bruises/bleeds easily.  Psychiatric/Behavioral: Negative for dysphoric mood. The patient is  not nervous/anxious.       Objective:   Physical Exam Constitutional:      Appearance: She is obese.  HENT:     Head: Normocephalic.     Nose: Nose normal.     Mouth/Throat:     Mouth: Mucous membranes are moist.     Comments: Mallampati 4, crowded oropharynx Eyes:     General:        Right eye: No discharge.        Left eye: No discharge.     Pupils: Pupils are equal, round, and reactive to light.  Neck:     Musculoskeletal: Normal range of motion. No neck rigidity.     Comments: Neck is thick Cardiovascular:     Rate and Rhythm: Normal rate.     Heart sounds: No murmur.  Pulmonary:     Effort: Pulmonary effort is  normal. No respiratory distress.     Breath sounds: Normal breath sounds. No stridor. No wheezing or rhonchi.  Musculoskeletal: Normal range of motion.        General: No swelling.  Skin:    General: Skin is warm.  Neurological:     General: No focal deficit present.     Mental Status: She is alert.  Psychiatric:        Mood and Affect: Mood normal.     Vitals:   06/20/19 1127  BP: 128/80  Pulse: 67  SpO2: 99%   Results of the Epworth flowsheet 06/20/2019  Sitting and reading 2  Watching TV 3  Sitting, inactive in a public place (e.g. a theatre or a meeting) 1  As a passenger in a car for an hour without a break 1  Lying down to rest in the afternoon when circumstances permit 1  Sitting and talking to someone 0  Sitting quietly after a lunch without alcohol 0  In a car, while stopped for a few minutes in traffic 0  Total score 8      Assessment & Plan:  High probability of significant obstructive sleep apnea .Daytime sleepiness, significant snoring, crowded oropharynx   Morbid obesity .  Contribution of significant obesity to propensity to obstructive sleep apnea discussed elevated .  Importance of weight loss-diet and exercise discussed  Excessive daytime sleepiness .  Likely related to significant obstructive sleep apnea  Hypertension .  Controlled at present  Pathophysiology of sleep disordered breathing discussed with the patient  Treatment options for sleep disordered breathing discussed with the patient Plan: Schedule patient for home sleep study  Follow-up in 3 months

## 2019-06-20 NOTE — Telephone Encounter (Signed)
Please advise if these forms have been taken care of. Thanks.

## 2019-06-20 NOTE — Patient Instructions (Signed)
Moderate to high probability of significant sleep disordered breathing  We will set you up for home sleep study  Treatment options as discussed  I will see you back in the office in 3 months  Call with significant concerns   Sleep Apnea Sleep apnea is a condition in which breathing pauses or becomes shallow during sleep. Episodes of sleep apnea usually last 10 seconds or longer, and they may occur as many as 20 times an hour. Sleep apnea disrupts your sleep and keeps your body from getting the rest that it needs. This condition can increase your risk of certain health problems, including:  Heart attack.  Stroke.  Obesity.  Diabetes.  Heart failure.  Irregular heartbeat. What are the causes? There are three kinds of sleep apnea:  Obstructive sleep apnea. This kind is caused by a blocked or collapsed airway.  Central sleep apnea. This kind happens when the part of the brain that controls breathing does not send the correct signals to the muscles that control breathing.  Mixed sleep apnea. This is a combination of obstructive and central sleep apnea. The most common cause of this condition is a collapsed or blocked airway. An airway can collapse or become blocked if:  Your throat muscles are abnormally relaxed.  Your tongue and tonsils are larger than normal.  You are overweight.  Your airway is smaller than normal. What increases the risk? You are more likely to develop this condition if you:  Are overweight.  Smoke.  Have a smaller than normal airway.  Are elderly.  Are female.  Drink alcohol.  Take sedatives or tranquilizers.  Have a family history of sleep apnea. What are the signs or symptoms? Symptoms of this condition include:  Trouble staying asleep.  Daytime sleepiness and tiredness.  Irritability.  Loud snoring.  Morning headaches.  Trouble concentrating.  Forgetfulness.  Decreased interest in sex.  Unexplained sleepiness.  Mood  swings.  Personality changes.  Feelings of depression.  Waking up often during the night to urinate.  Dry mouth.  Sore throat. How is this diagnosed? This condition may be diagnosed with:  A medical history.  A physical exam.  A series of tests that are done while you are sleeping (sleep study). These tests are usually done in a sleep lab, but they may also be done at home. How is this treated? Treatment for this condition aims to restore normal breathing and to ease symptoms during sleep. It may involve managing health issues that can affect breathing, such as high blood pressure or obesity. Treatment may include:  Sleeping on your side.  Using a decongestant if you have nasal congestion.  Avoiding the use of depressants, including alcohol, sedatives, and narcotics.  Losing weight if you are overweight.  Making changes to your diet.  Quitting smoking.  Using a device to open your airway while you sleep, such as: ? An oral appliance. This is a custom-made mouthpiece that shifts your lower jaw forward. ? A continuous positive airway pressure (CPAP) device. This device blows air through a mask when you breathe out (exhale). ? A nasal expiratory positive airway pressure (EPAP) device. This device has valves that you put into each nostril. ? A bi-level positive airway pressure (BPAP) device. This device blows air through a mask when you breathe in (inhale) and breathe out (exhale).  Having surgery if other treatments do not work. During surgery, excess tissue is removed to create a wider airway. It is important to get treatment for sleep  apnea. Without treatment, this condition can lead to:  High blood pressure.  Coronary artery disease.  In men, an inability to achieve or maintain an erection (impotence).  Reduced thinking abilities. Follow these instructions at home: Lifestyle  Make any lifestyle changes that your health care provider recommends.  Eat a healthy,  well-balanced diet.  Take steps to lose weight if you are overweight.  Avoid using depressants, including alcohol, sedatives, and narcotics.  Do not use any products that contain nicotine or tobacco, such as cigarettes, e-cigarettes, and chewing tobacco. If you need help quitting, ask your health care provider. General instructions  Take over-the-counter and prescription medicines only as told by your health care provider.  If you were given a device to open your airway while you sleep, use it only as told by your health care provider.  If you are having surgery, make sure to tell your health care provider you have sleep apnea. You may need to bring your device with you.  Keep all follow-up visits as told by your health care provider. This is important. Contact a health care provider if:  The device that you received to open your airway during sleep is uncomfortable or does not seem to be working.  Your symptoms do not improve.  Your symptoms get worse. Get help right away if:  You develop: ? Chest pain. ? Shortness of breath. ? Discomfort in your back, arms, or stomach.  You have: ? Trouble speaking. ? Weakness on one side of your body. ? Drooping in your face. These symptoms may represent a serious problem that is an emergency. Do not wait to see if the symptoms will go away. Get medical help right away. Call your local emergency services (911 in the [place] Do not drive yourself to the hospital. Summary  Sleep apnea is a condition in which breathing pauses or becomes shallow during sleep.  The most common cause is a collapsed or blocked airway.  The goal of treatment is to restore normal breathing and to ease symptoms during sleep. This information is not intended to replace advice given to you by your health care provider. Make sure you discuss any questions you have with your health care provider. Document Released: 08/05/2002 Document Revised: 06/01/2018 Document  Reviewed: 04/10/2018 Elsevier Patient Education  2020 [employer].

## 2019-06-25 ENCOUNTER — Telehealth: Payer: Self-pay | Admitting: Internal Medicine

## 2019-06-25 NOTE — Telephone Encounter (Signed)
Per 10/12 phone note, it is unclear if we ever even received forms from [employer] to fill out.  MR please advise if you've received disability forms on this patient, and if you have please advise on status of forms.  If not, we can call Vern back to re-request these.  Thanks!

## 2019-06-26 NOTE — Telephone Encounter (Signed)
I have not received these forms yet..

## 2019-06-26 NOTE — Telephone Encounter (Signed)
Jamie Burton - please advise.

## 2019-06-28 NOTE — Telephone Encounter (Signed)
Done and to right side of my desk - side desk with other papers which can be picked up too

## 2019-07-01 NOTE — Telephone Encounter (Signed)
Completed forms retrieved from MR's desk and given to [employer] [facility]

## 2019-07-01 NOTE — Telephone Encounter (Signed)
See telephone encounter dated 06/25/2019-pr

## 2019-07-01 NOTE — Telephone Encounter (Signed)
Rec'd paperwork back - one sheet is not signed - will give to MR on Monday 07/08/2019  When he returns to the office -pr

## 2019-07-02 NOTE — Telephone Encounter (Signed)
Ok thanks [employer].

## 2019-07-08 ENCOUNTER — Other Ambulatory Visit: Payer: Self-pay

## 2019-07-08 ENCOUNTER — Ambulatory Visit: Payer: 59

## 2019-07-08 ENCOUNTER — Telehealth: Payer: Self-pay | Admitting: Internal Medicine

## 2019-07-08 DIAGNOSIS — G4733 Obstructive sleep apnea (adult) (pediatric): Secondary | ICD-10-CM | POA: Diagnosis not present

## 2019-07-08 DIAGNOSIS — R0683 Snoring: Secondary | ICD-10-CM

## 2019-07-08 NOTE — Telephone Encounter (Signed)
MR please advise if you are comfortable d/c'ing the pt's nocturnal O2.  Thanks!

## 2019-07-08 NOTE — Telephone Encounter (Signed)
Will give forms to [facility] for Dr. Chase Caller to complete -pr

## 2019-07-08 NOTE — Telephone Encounter (Signed)
FMLA paperwork handed to MR for him to fill out. Will keep encounter open until he has returned it to me for me to return back to [place].

## 2019-07-08 NOTE — Telephone Encounter (Signed)
Patient picked up the [place] today. She stated she was sent home from the hospital with 02. She has been keeping check on her 02 levels at home and they have been staying in the mid to high 90's. She uses [employer] and she would like an order for the 02 to be discontinued. If you feel this is ok would you place and order for the 02 to be D/C

## 2019-07-10 DIAGNOSIS — G4733 Obstructive sleep apnea (adult) (pediatric): Secondary | ICD-10-CM | POA: Diagnosis not present

## 2019-07-11 NOTE — Telephone Encounter (Signed)
Paperwork has not been returned to me by MR yet

## 2019-07-11 NOTE — Telephone Encounter (Signed)
Raquel Sarna, do you know if MR has signed this paperwork yet? Please advise.

## 2019-07-12 ENCOUNTER — Telehealth: Payer: Self-pay | Admitting: Pulmonary Disease

## 2019-07-12 DIAGNOSIS — G4733 Obstructive sleep apnea (adult) (pediatric): Secondary | ICD-10-CM

## 2019-07-12 NOTE — Telephone Encounter (Signed)
Called and spoke with patient.  Dr. Judson Roch  Results and recommendations given.  DME order placed.  Patient stated understanding. Nothing further at this time.  Dr. Ander Slade has reviewed the home sleep test this showed severe obstructive sleep apnea.   Recommendations   Treatment options are CPAP with the settings auto 5 to 20.    Weight loss measures .   Advise against driving while sleepy & against medication with sedative side effects.    Make appointment for 3 months for compliance with download with Dr. Ander Slade.

## 2019-07-12 NOTE — Telephone Encounter (Signed)
Rec'd completed paperwork - fwd to [employer] via interoffice mail -pr  

## 2019-07-18 ENCOUNTER — Encounter (HOSPITAL_COMMUNITY)
Admission: RE | Admit: 2019-07-18 | Discharge: 2019-07-18 | Disposition: A | Discharge status: Home / Self Care | Payer: No Typology Code available for payment source | Source: Ambulatory Visit | Attending: Internal Medicine | Admitting: Internal Medicine

## 2019-07-18 ENCOUNTER — Other Ambulatory Visit: Payer: Self-pay

## 2019-07-23 NOTE — Telephone Encounter (Signed)
She has severe sleep apnea with severe desaturations at night.  Most recently seen by Dr. Meryl Dare. I am sending the message to him

## 2019-07-23 NOTE — Telephone Encounter (Signed)
Called patient to make her aware. She stated she would need help connecting the O2, she does not know how. I told her an order can be sent to the DME company to request assistance. Patient voiced understanding. Nothing further needed at this time.

## 2019-07-23 NOTE — Telephone Encounter (Signed)
Should not discontinue oxygen supplementation she was desaturating to 56% on home sleep study

## 2019-07-29 ENCOUNTER — Telehealth: Payer: Self-pay | Admitting: Pulmonary Disease

## 2019-07-29 NOTE — Telephone Encounter (Signed)
I called and spoke with the patient and made her aware that I sent her a my chart message and attached the full sleep study report. Nothing further is needed.

## 2019-08-05 ENCOUNTER — Other Ambulatory Visit: Payer: Self-pay

## 2019-08-05 ENCOUNTER — Encounter (HOSPITAL_COMMUNITY)
Admission: RE | Admit: 2019-08-05 | Discharge: 2019-08-05 | Disposition: A | Discharge status: Home / Self Care | Payer: No Typology Code available for payment source | Source: Ambulatory Visit | Attending: Internal Medicine | Admitting: Internal Medicine

## 2019-08-05 VITALS — BP 108/68 | HR 64 | Temp 96.8°F | Ht 65.0 in | Wt >= 6400 oz

## 2019-08-05 DIAGNOSIS — R5381 Other malaise: Secondary | ICD-10-CM | POA: Insufficient documentation

## 2019-08-05 DIAGNOSIS — J1282 Pneumonia due to coronavirus disease 2019: Secondary | ICD-10-CM

## 2019-08-05 DIAGNOSIS — J1289 Other viral pneumonia: Secondary | ICD-10-CM | POA: Insufficient documentation

## 2019-08-05 DIAGNOSIS — U071 COVID-19: Secondary | ICD-10-CM

## 2019-08-05 NOTE — Progress Notes (Signed)
Cardiac/Pulmonary Rehab Medication Review by a Pharmacist  Does the patient  feel that his/her medications are working for him/her?  yes  Has the patient been experiencing any side effects to the medications prescribed?  no  Does the patient measure his/her own blood pressure or blood glucose at home?  no   Does the patient have any problems obtaining medications due to transportation or finances?   no  Understanding of regimen: good Understanding of indications: good Potential of compliance: good  Questions asked to Determine Patient Understanding of Medication Regimen:  1. What is the name of the medication?  2. What is the medication used for?  3. When should it be taken?  4. How much should be taken?  5. How will you take it?  6. What side effects should you report?  Understanding Defined as: Excellent: All questions above are correct Good: Questions 1-4 are correct Fair: Questions 1-2 are correct  Poor: 1 or none of the above questions are correct   Pharmacist comments: Overall, patient has good understanding medication and high compliance. She has no further questions and has been on most of her medications for awhile.  Eliquis is newer medication and insurance helps with coverage.    Ramond Craver 08/05/2019 8:43 AM

## 2019-08-05 NOTE — Progress Notes (Signed)
Pulmonary Individual Treatment Plan  Patient Details  Name: Jamie Burton MRN: [id number] Date of Birth: 11-Oct-1965 Referring Provider:     PULMONARY REHAB OTHER [facility]  Referring Provider  [name]      Initial Encounter Date:    PULMONARY REHAB OTHER RESP OR[facility]ATION [facility]05/2019 [facility]  Date  08/05/19      Visit Diagnosis: Pneumonia due to COVID-19 virus  Patient's Home Medications on Admission:   Current Outpatient Medications:  .  albuterol (VENTOLIN HFA) 108 (90 Base) MCG/ACT inhaler, Inhale 2 puffs into the lungs every 6 (six) hours as needed for wheezing or shortness of breath., Disp: 6.7 g, Rfl: 0 .  amLODipine (NORVASC) 10 MG tablet, Take 1 tablet (10 mg total) by mouth daily., Disp: 30 tablet, Rfl: 0 .  apixaban (ELIQUIS) 5 MG TABS tablet, Take 1 tablet (5 mg total) by mouth 2 (two) times daily., Disp: 60 tablet, Rfl: 0 .  atenolol (TENORMIN) 50 MG tablet, Take 50 mg by mouth daily., Disp: , Rfl:  .  hydrochlorothiazide (HYDRODIURIL) 25 MG tablet, Take 25 mg by mouth daily., Disp: , Rfl:  .  lovastatin (MEVACOR) 20 MG tablet, Take 40 mg by mouth daily. , Disp: , Rfl:   Past Medical History: Past Medical History:  Diagnosis Date  . Arthritis   . Dyspnea   . Heart murmur    told she had a heart murmur as a child but unsure now  . Hyperlipidemia   . Hypertension   . Pneumonia due to COVID-19 virus 04/30/2019  . Pre-diabetes     Tobacco Use: Social History   Tobacco Use  Smoking Status Never Smoker  Smokeless Tobacco Never Used    Labs: Recent Review Flowsheet Data    Labs for [facility]P Cardiac and Pulmonary Rehab Latest Ref Rng & Units 05/01/2019 05/02/2019   Cholestrol 0 - 200 mg/dL - 782187   LDLCALC 0 - 99 mg/dL - 956(O127(H)   HDL >13>40 mg/dL - 08(M36(L)   Trlycerides <578<150 mg/dL - 469122   Hemoglobin G2XA1c 4.8 - 5.6 % 6.6(H) -      Capillary Blood Glucose: Lab Results   Component Value Date   GLUCAP 137 (H) 05/05/2019   GLUCAP 176 (H) 05/04/2019   GLUCAP 240 (H) 05/04/2019   GLUCAP 205 (H) 05/04/2019   GLUCAP 145 (H) 05/04/2019     Pulmonary Assessment Scores: Pulmonary Assessment Scores    Row Name 08/05/19 1138         ADL UCSD   ADL Phase  Entry     SOB Score total  48     Rest  0     Walk  10     Stairs  3     Bath  3     Dress  3     Shop  3       CAT Score   CAT Score  9       mMRC Score   mMRC Score  3       UCSD: Self-administered rating of dyspnea associated with activities of daily living (ADLs) 6-point scale (0 = "not at all" to 5 = "maximal or unable to do because of breathlessness")  Scoring Scores range from 0 to 120.  Minimally important difference is 5 units  CAT: CAT can identify the health impairment of COPD patients and is better correlated with disease progression.  CAT has a scoring range of  zero to 40. The CAT score is classified into four groups of low (less than 10), medium (10 - 20), high (21-30) and very high (31-40) based on the impact level of disease on health status. A CAT score over 10 suggests significant symptoms.  A worsening CAT score could be explained by an exacerbation, poor medication adherence, poor inhaler technique, or progression of COPD or comorbid conditions.  CAT MCID is 2 points  mMRC: mMRC (Modified Medical Research Council) Dyspnea Scale is used to assess the degree of baseline functional disability in patients of respiratory disease due to dyspnea. No minimal important difference is established. A decrease in score of 1 point or greater is considered a positive change.   Pulmonary Function Assessment:   Exercise Target Goals: Exercise Program Goal: Individual exercise prescription set using results from initial 6 min walk test and THRR while considering  patient's activity barriers and safety.   Exercise Prescription Goal: Initial exercise prescription builds to 30-45 minutes a  day of aerobic activity, 2-3 days per week.  Home exercise guidelines will be given to patient during program as part of exercise prescription that the participant will acknowledge.  Activity Barriers & Risk Stratification:   6 Minute Walk: 6 Minute Walk    Row Name 08/05/19 1007         6 Minute Walk   Phase  Initial     Distance  800 feet     Walk Time  6 minutes     # of Rest Breaks  5 rest-1:21, 2:10, 10 seconds, 22 seconds, 10 seconds     MPH  1.51     METS  2.16     RPE  12     Perceived Dyspnea   16     VO2 Peak  2.84     Symptoms  Yes (comment)     Comments  Patient was very short of breath, had to stop to rest 5 times. Complained of bilateral knee pain 3/10. Knee pain gone after 2 minutes rest.     Resting HR  64 bpm     Resting BP  108/68     Resting Oxygen Saturation   92 %     Exercise Oxygen Saturation  during 6 min walk  89 %     Max Ex. HR  101 bpm     Max Ex. BP  138/110     2 Minute Post BP  128/78        Oxygen Initial Assessment: Oxygen Initial Assessment - 08/05/19 1136      Home Oxygen   Home Oxygen Device  None    Sleep Oxygen Prescription  CPAP    Liters per minute  2    Home Exercise Oxygen Prescription  None    Home at Rest Exercise Oxygen Prescription  None    Compliance with Home Oxygen Use  Yes      Initial 6 min Walk   Oxygen Used  None      Program Oxygen Prescription   Program Oxygen Prescription  None       Oxygen Re-Evaluation:   Oxygen Discharge (Final Oxygen Re-Evaluation):   Initial Exercise Prescription: Initial Exercise Prescription - 08/05/19 1100      Date of Initial Exercise RX and Referring Provider   Date  08/05/19    Referring Provider  [name]    Expected Discharge Date  10/05/18      T5 Nustep   Level  1  SPM  60    Minutes  22    METs  1.4      Track   Laps  6    Minutes  17    METs  2.74      Prescription Details   Frequency (times per week)  2    Duration  Progress to 30 minutes of  continuous aerobic without signs/symptoms of physical distress      Intensity   THRR 40-80% of Max Heartrate  [phone]    Ratings of Perceived Exertion  11-13    Perceived Dyspnea  0-4      Progression   Progression  Continue to progress workloads to maintain intensity without signs/symptoms of physical distress.      Resistance Training   Training Prescription  Yes    Weight  1    Reps  10-15       Perform Capillary Blood Glucose checks as needed.  Exercise Prescription Changes:   Exercise Comments:   Exercise Goals and Review:  Exercise Goals    Row Name 08/05/19 1020             Exercise Goals   Increase Physical Activity  Yes       Intervention  Provide advice, education, support and counseling about physical activity/exercise needs.;Develop an individualized exercise prescription for aerobic and resistive training based on initial evaluation findings, risk stratification, comorbidities and participant's personal goals.       Expected Outcomes  Short Term: Attend rehab on a regular basis to increase amount of physical activity.;Long Term: Add in home exercise to make exercise part of routine and to increase amount of physical activity.;Long Term: Exercising regularly at least 3-5 days a week.       Increase Strength and Stamina  Yes       Intervention  Provide advice, education, support and counseling about physical activity/exercise needs.;Develop an individualized exercise prescription for aerobic and resistive training based on initial evaluation findings, risk stratification, comorbidities and participant's personal goals.       Expected Outcomes  Short Term: Increase workloads from initial exercise prescription for resistance, speed, and METs.;Short Term: Perform resistance training exercises routinely during rehab and add in resistance training at home;Long Term: Improve cardiorespiratory fitness, muscular endurance and strength as measured by increased METs and  functional capacity ( )       Able to understand and use rate of perceived exertion (RPE) scale  Yes       Intervention  Provide education and explanation on how to use RPE scale       Expected Outcomes  Short Term: Able to use RPE daily in rehab to express subjective intensity level;Long Term:  Able to use RPE to guide intensity level when exercising independently       Able to understand and use Dyspnea scale  Yes       Intervention  Provide education and explanation on how to use Dyspnea scale       Expected Outcomes  Short Term: Able to use Dyspnea scale daily in rehab to express subjective sense of shortness of breath during exertion;Long Term: Able to use Dyspnea scale to guide intensity level when exercising independently       Knowledge and understanding of Target Heart Rate Range (THRR)  Yes       Intervention  Provide education and explanation of THRR including how the numbers were predicted and where they are located for reference  Expected Outcomes  Short Term: Able to state/look up THRR;Long Term: Able to use THRR to govern intensity when exercising independently;Short Term: Able to use daily as guideline for intensity in rehab       Able to check pulse independently  Yes       Intervention  Provide education and demonstration on how to check pulse in carotid and radial arteries.;Review the importance of being able to check your own pulse for safety during independent exercise       Expected Outcomes  Short Term: Able to explain why pulse checking is important during independent exercise;Long Term: Able to check pulse independently and accurately       Understanding of Exercise Prescription  Yes       Intervention  Provide education, explanation, and written materials on patient's individual exercise prescription       Expected Outcomes  Short Term: Able to explain program exercise prescription;Long Term: Able to explain home exercise prescription to exercise independently           Exercise Goals Re-Evaluation :   Discharge Exercise Prescription (Final Exercise Prescription Changes):   Nutrition:  Target Goals: Understanding of nutrition guidelines, daily intake of sodium 1500mg , cholesterol 200mg , calories 30% from fat and 7% or less from saturated fats, daily to have 5 or more servings of fruits and vegetables.  Biometrics: Pre Biometrics - 08/05/19 1015      Pre Biometrics   Waist Circumference  56 inches    Hip Circumference  65 inches    Waist to Hip Ratio  0.86 %    Triceps Skinfold  80 mm    % Body Fat  74.3 %    Grip Strength  11.4 kg    Flexibility  0 in    Single Leg Stand  7.33 seconds        Nutrition Therapy Plan and Nutrition Goals: Nutrition Therapy & Goals - 08/05/19 1141      Personal Nutrition Goals   Personal Goal #2  Patient has been encourage to eat a more heart healthy diet to promote weight lose. Handout given on how to make heart healthy food choices.    Additional Goals?  No      Intervention Plan   Intervention  Nutrition handout(s) given to patient.       Nutrition Assessments: Nutrition Assessments - 08/05/19 1142      MEDFICTS Scores   Pre Score  65       Nutrition Goals Re-Evaluation:   Nutrition Goals Discharge (Final Nutrition Goals Re-Evaluation):   Psychosocial: Target Goals: Acknowledge presence or absence of significant depression and/or stress, maximize coping skills, provide positive support system. Participant is able to verbalize types and ability to use techniques and skills needed for reducing stress and depression.  Initial Review & Psychosocial Screening: Initial Psych Review & Screening - 08/05/19 1139      Initial Review   Current issues with  None Identified;Current Depression   A little down because she is not able to work right now.     Family Dynamics   Good Support System?  Yes      Barriers   Psychosocial barriers to participate in program  There are no identifiable  barriers or psychosocial needs.      Screening Interventions   Interventions  Encouraged to exercise    Expected Outcomes  Short Term goal: Identification and review with participant of any Quality of Life or Depression concerns found by scoring the questionnaire.;Long  Term goal: The participant improves quality of Life and PHQ9 Scores as seen by post scores and/or verbalization of changes       Quality of Life Scores: Quality of Life - 08/05/19 1020      Quality of Life   Select  Quality of Life      Quality of Life Scores   Health/Function Pre  20.75 %    Socioeconomic Pre  20.75 %    Psych/Spiritual Pre  21 %    Family Pre  20.8 %    GLOBAL Pre  20.81 %      Scores of 19 and below usually indicate a poorer quality of life in these areas.  A difference of  2-3 points is a clinically meaningful difference.  A difference of 2-3 points in the total score of the Quality of Life Index has been associated with significant improvement in overall quality of life, self-image, physical symptoms, and general health in studies assessing change in quality of life.   PHQ-9: Recent Review Flowsheet Data    Depression screen [facility] 2/9 08/05/2019   Decreased Interest 0   Down, Depressed, Hopeless 1   PHQ - 2 Score 1   Altered sleeping 2   Tired, decreased energy 1   Change in appetite 1   Feeling bad or failure about yourself  0   Trouble concentrating 0   Moving slowly or fidgety/restless 0   Suicidal thoughts 0   PHQ-9 Score 5   Difficult doing work/chores Not difficult at all     Interpretation of Total Score  Total Score Depression Severity:  1-4 = Minimal depression, 5-9 = Mild depression, 10-14 = Moderate depression, 15-19 = Moderately severe depression, 20-27 = Severe depression   Psychosocial Evaluation and Intervention: Psychosocial Evaluation - 08/05/19 1140      Psychosocial Evaluation & Interventions   Interventions  Encouraged to exercise with the program and follow  exercise prescription    Continue Psychosocial Services   No Follow up required       Psychosocial Re-Evaluation:   Psychosocial Discharge (Final Psychosocial Re-Evaluation):    Education: Education Goals: Education classes will be provided on a weekly basis, covering required topics. Participant will state understanding/return demonstration of topics presented.  Learning Barriers/Preferences: Learning Barriers/Preferences - 08/05/19 1024      Learning Barriers/Preferences   Learning Barriers  None    Learning Preferences  Pictoral;Video;Written Material;Computer/Internet       Education Topics: How Lungs Work and Diseases: - Discuss the anatomy of the lungs and diseases that can affect the lungs, such as COPD.   Exercise: -Discuss the importance of exercise, FITT principles of exercise, normal and abnormal responses to exercise, and how to exercise safely.   Environmental Irritants: -Discuss types of environmental irritants and how to limit exposure to environmental irritants.   Meds/Inhalers and oxygen: - Discuss respiratory medications, definition of an inhaler and oxygen, and the proper way to use an inhaler and oxygen.   Energy Saving Techniques: - Discuss methods to conserve energy and decrease shortness of breath when performing activities of daily living.    Bronchial Hygiene / Breathing Techniques: - Discuss breathing mechanics, pursed-lip breathing technique,  proper posture, effective ways to clear airways, and other functional breathing techniques   Cleaning Equipment: - Provides group verbal and written instruction about the health risks of elevated stress, cause of high stress, and healthy ways to reduce stress.   Nutrition I: Fats: - Discuss the types of cholesterol, what  cholesterol does to the body, and how cholesterol levels can be controlled.   Nutrition II: Labels: -Discuss the different components of food labels and how to read food  labels.   Respiratory Infections: - Discuss the signs and symptoms of respiratory infections, ways to prevent respiratory infections, and the importance of seeking medical treatment when having a respiratory infection.   Stress I: Signs and Symptoms: - Discuss the causes of stress, how stress may lead to anxiety and depression, and ways to limit stress.   Stress II: Relaxation: -Discuss relaxation techniques to limit stress.   Oxygen for Home/Travel: - Discuss how to prepare for travel when on oxygen and proper ways to transport and store oxygen to ensure safety.   Knowledge Questionnaire Score: Knowledge Questionnaire Score - 08/05/19 1143      Knowledge Questionnaire Score   Pre Score  12/18       Core Components/Risk Factors/Patient Goals at Admission: Personal Goals and Risk Factors at Admission - 08/05/19 1145      Core Components/Risk Factors/Patient Goals on Admission    Weight Management  Yes    Intervention  Weight Management/Obesity: Establish reasonable short term and long term weight goals.    Admit Weight  416 lb 9.6 oz (189 kg)    Goal Weight: Short Term  409 lb 1.6 oz (185.6 kg)    Goal Weight: Long Term  401 lb 9.6 oz (182.2 kg)    Expected Outcomes  Short Term: Continue to assess and modify interventions until short term weight is achieved;Long Term: Adherence to nutrition and physical activity/exercise program aimed toward attainment of established weight goal    Improve shortness of breath with ADL's  Yes    Intervention  Provide education, individualized exercise plan and daily activity instruction to help decrease symptoms of SOB with activities of daily living.    Expected Outcomes  Short Term: Improve cardiorespiratory fitness to achieve a reduction of symptoms when performing ADLs;Long Term: Be able to perform more ADLs without symptoms or delay the onset of symptoms    Personal Goal Other  Yes    Personal Goal  To breath better and to lose 15lbs.     Intervention  Attend program 2 x week and to supplement with home exercise plan 3 x week.    Expected Outcomes  to reach short term and long term goals.       Core Components/Risk Factors/Patient Goals Review:    Core Components/Risk Factors/Patient Goals at Discharge (Final Review):    ITP Comments:   Comments: Patient arrived for 1st visit/orientation/education at 0800.  Patient was referred to PR by Dr. Marchelle Gearing due to Pneumonia due to COVID-19 (U07.1). During orientation advised patient on arrival and appointment times what to wear, what to do before, during and after exercise. Reviewed attendance and class policy. Talked about inclement weather and class consultation policy. Pt is scheduled to return Pulmonary Rehab on 08/06/19 at 1045. Pt was advised to come to class 15 minutes before class starts. Patient was also given instructions on meeting with the dietician and attending the Family Structure classes. Discussed RPE/Dpysnea scales. Discussed initial THR and how to find their radial and/or carotid pulse. Discussed the initial exercise prescription and how this effects their progress. Pt is eager to get started. Patient participated in warm up stretches followed by light weights and resistance bands. Patient was able to complete 6 minute walk test which lasted 10 minutes. She had to stop and rest 5 times during the  walk test. Her rest intervals in minutes and seconds were as follows; 1:21, 2:10, 10 seconds, 22 seconds, and 10 seconds. She c/o bilateral knee pain in 3/10. Knee pain subsided to 0/10 at 2 minute rest. Pain was completely gone at the end of orientation. Patient was measured for the equipment. Discussed equipment safety with patient. Took patient pre-anthropometric measurements. Patient finished visit at 10:30.

## 2019-08-05 NOTE — Progress Notes (Signed)
Daily Session Note  Patient Details  Name: Jamie Burton MRN: [id number] Date of Birth: February 04, 1966 Referring Provider:     PULMONARY REHAB OTHER RESP ORIENTATION from 08/05/2019 in [place]  Referring Provider  [name]      Encounter Date: 08/05/2019  Check In: Session Check In - 08/05/19 1024      Check-In   Supervising physician immediately available to respond to emergencies  See telemetry face sheet for immediately available MD    Location  [facility]    Staff Present  [name], MS, EP, [facility], Exercise Physiologist;Debra Wynetta Emery, RN, BSN;Other    Virtual Visit  No    Medication changes reported      No    Fall or balance concerns reported     No    Tobacco Cessation  No Change   Never smoked   Warm-up and Cool-down  Performed as group-led instruction    Resistance Training Performed  Yes    VAD Patient?  No    PAD/SET Patient?  No      Pain Assessment   Currently in Pain?  No/denies    Pain Score  0-No pain       Capillary Blood Glucose: No results found for this or any previous visit (from the past 24 hour(s)).    Social History   Tobacco Use  Smoking Status Never Smoker  Smokeless Tobacco Never Used    Goals Met:  Proper associated with RPD/PD & O2 Sat Independence with exercise equipment Exercise tolerated well Personal goals reviewed No report of cardiac concerns or symptoms Strength training completed today  Goals Unmet:  Not Applicable  Comments: Check out 1030   Dr. Sinda Du is Medical Director for [facility].

## 2019-08-06 ENCOUNTER — Encounter (HOSPITAL_COMMUNITY)
Admission: RE | Admit: 2019-08-06 | Discharge: 2019-08-06 | Disposition: A | Discharge status: Home / Self Care | Payer: No Typology Code available for payment source | Source: Ambulatory Visit | Attending: Internal Medicine | Admitting: Internal Medicine

## 2019-08-06 DIAGNOSIS — U071 COVID-19: Secondary | ICD-10-CM | POA: Diagnosis present

## 2019-08-06 DIAGNOSIS — J1289 Other viral pneumonia: Secondary | ICD-10-CM | POA: Insufficient documentation

## 2019-08-06 DIAGNOSIS — J1282 Pneumonia due to coronavirus disease 2019: Secondary | ICD-10-CM

## 2019-08-06 NOTE — Progress Notes (Signed)
Daily Session Note  Patient Details  Name: Jamie Burton MRN: [id number] Date of Birth: [birth year] Referring Provider:     PULMONARY REHAB OTHER RESP ORIENTATION from 08/05/2019 in [place]  Referring Provider  [name]      Encounter Date: 08/06/2019  Check In: Session Check In - 08/06/19 1438      Check-In   Supervising physician immediately available to respond to emergencies  See telemetry face sheet for immediately available MD    Location  [facility]    Staff Present  [name], MS, EP, [facility], Exercise Physiologist;Other    Virtual Visit  No    Medication changes reported      No    Fall or balance concerns reported     No    Tobacco Cessation  No Change    Warm-up and Cool-down  Performed as group-led instruction    Resistance Training Performed  No    VAD Patient?  No    PAD/SET Patient?  No      Pain Assessment   Currently in Pain?  No/denies    Pain Score  0-No pain    Multiple Pain Sites  No       Capillary Blood Glucose: No results found for this or any previous visit (from the past 24 hour(s)).    Social History   Tobacco Use  Smoking Status Never Smoker  Smokeless Tobacco Never Used    Goals Met:  Proper associated with RPD/PD & O2 Sat Independence with exercise equipment Improved SOB with ADL's Using PLB without cueing & demonstrates good technique Exercise tolerated well Personal goals reviewed No report of cardiac concerns or symptoms Strength training completed today  Goals Unmet:  Not Applicable  Comments: Check out:1145   Dr. Sinda Du is Medical Director for [facility].

## 2019-08-08 ENCOUNTER — Encounter (HOSPITAL_COMMUNITY)
Admission: RE | Admit: 2019-08-08 | Discharge: 2019-08-08 | Disposition: A | Discharge status: Home / Self Care | Payer: No Typology Code available for payment source | Source: Ambulatory Visit | Attending: Internal Medicine | Admitting: Internal Medicine

## 2019-08-08 ENCOUNTER — Other Ambulatory Visit: Payer: Self-pay

## 2019-08-08 DIAGNOSIS — J1282 Pneumonia due to coronavirus disease 2019: Secondary | ICD-10-CM

## 2019-08-08 DIAGNOSIS — U071 COVID-19: Secondary | ICD-10-CM | POA: Diagnosis not present

## 2019-08-08 NOTE — Progress Notes (Signed)
Daily Session Note  Patient Details  Name: Jamie Burton MRN: [id number] Date of Birth: 02-28-1966 Referring Provider:     PULMONARY REHAB OTHER RESP ORIENTATION from 08/05/2019 in [place]  Referring Provider  [name]      Encounter Date: 08/08/2019  Check In: Session Check In - 08/08/19 1212      Check-In   Supervising physician immediately available to respond to emergencies  See telemetry face sheet for immediately available MD    Location  [facility]    Staff Present  [name], MS, EP, [facility], Exercise Physiologist;Debra Wynetta Emery, RN, BSN    Virtual Visit  No    Medication changes reported      No    Fall or balance concerns reported     No    Tobacco Cessation  No Change    Warm-up and Cool-down  Performed as group-led instruction    Resistance Training Performed  Yes    VAD Patient?  No    PAD/SET Patient?  No      Pain Assessment   Currently in Pain?  No/denies    Pain Score  0-No pain    Multiple Pain Sites  No       Capillary Blood Glucose: No results found for this or any previous visit (from the past 24 hour(s)).    Social History   Tobacco Use  Smoking Status Never Smoker  Smokeless Tobacco Never Used    Goals Met:  Proper associated with RPD/PD & O2 Sat Independence with exercise equipment Improved SOB with ADL's Using PLB without cueing & demonstrates good technique Exercise tolerated well Personal goals reviewed No report of cardiac concerns or symptoms Strength training completed today  Goals Unmet:  Not Applicable  Comments: Check out: 1200   Dr. Sinda Du is Medical Director for [facility].

## 2019-08-13 ENCOUNTER — Other Ambulatory Visit: Payer: Self-pay

## 2019-08-13 ENCOUNTER — Encounter (HOSPITAL_COMMUNITY)
Admission: RE | Admit: 2019-08-13 | Discharge: 2019-08-13 | Disposition: A | Discharge status: Home / Self Care | Payer: No Typology Code available for payment source | Source: Ambulatory Visit | Attending: Internal Medicine | Admitting: Internal Medicine

## 2019-08-13 DIAGNOSIS — J1282 Pneumonia due to coronavirus disease 2019: Secondary | ICD-10-CM

## 2019-08-13 DIAGNOSIS — U071 COVID-19: Secondary | ICD-10-CM | POA: Diagnosis not present

## 2019-08-14 NOTE — Progress Notes (Signed)
Daily Session Note  Patient Details  Name: Jamie Burton MRN: [id number] Date of Birth: 1965-10-02 Referring Provider:     PULMONARY REHAB OTHER RESP ORIENTATION from 08/05/2019 in [place]  Referring Provider  [name]      Encounter Date: 08/13/2019  Check In: Session Check In - 08/14/19 1133      Check-In   Supervising physician immediately available to respond to emergencies  See telemetry face sheet for immediately available MD    Location  [facility]    Staff Present  [name], MS, EP, [facility], Exercise Physiologist;Other    Virtual Visit  No    Medication changes reported      No    Fall or balance concerns reported     No    Tobacco Cessation  No Change    Warm-up and Cool-down  Performed as group-led instruction    Resistance Training Performed  Yes    VAD Patient?  No    PAD/SET Patient?  No      Pain Assessment   Currently in Pain?  No/denies    Pain Score  0-No pain    Multiple Pain Sites  No       Capillary Blood Glucose: No results found for this or any previous visit (from the past 24 hour(s)).    Social History   Tobacco Use  Smoking Status Never Smoker  Smokeless Tobacco Never Used    Goals Met:  Proper associated with RPD/PD & O2 Sat Improved SOB with ADL's Using PLB without cueing & demonstrates good technique Exercise tolerated well Personal goals reviewed No report of cardiac concerns or symptoms Strength training completed today  Goals Unmet:  Not Applicable  Comments: Check out: [place]   Dr. Sinda Du is Medical Director for [facility] Pulmonary Rehab.

## 2019-08-15 ENCOUNTER — Encounter (HOSPITAL_COMMUNITY)
Admission: RE | Admit: 2019-08-15 | Discharge: 2019-08-15 | Disposition: A | Discharge status: Home / Self Care | Payer: No Typology Code available for payment source | Source: Ambulatory Visit | Attending: Internal Medicine | Admitting: Internal Medicine

## 2019-08-15 ENCOUNTER — Other Ambulatory Visit: Payer: Self-pay

## 2019-08-15 DIAGNOSIS — U071 COVID-19: Secondary | ICD-10-CM | POA: Diagnosis not present

## 2019-08-15 DIAGNOSIS — J1282 Pneumonia due to coronavirus disease 2019: Secondary | ICD-10-CM

## 2019-08-15 NOTE — Progress Notes (Signed)
Daily Session Note  Patient Details  Name: Jamie Burton MRN: [id number] Date of Birth: 1965-11-04 Referring Provider:     PULMONARY REHAB OTHER RESP ORIENTATION from 08/05/2019 in [place]  Referring Provider  [name]      Encounter Date: 08/15/2019  Check In: Session Check In - 08/15/19 1045      Check-In   Supervising physician immediately available to respond to emergencies  See telemetry face sheet for immediately available MD    Location  [facility]    Staff Present  [name], MS, EP, [facility], Exercise Physiologist;Latroy Gaymon Wynetta Emery, RN, BSN    Virtual Visit  No    Medication changes reported      No    Fall or balance concerns reported     No    Tobacco Cessation  No Change    Warm-up and Cool-down  Performed as group-led instruction    Resistance Training Performed  Yes    VAD Patient?  No    PAD/SET Patient?  No      Pain Assessment   Currently in Pain?  No/denies    Pain Score  0-No pain    Multiple Pain Sites  No       Capillary Blood Glucose: No results found for this or any previous visit (from the past 24 hour(s)).    Social History   Tobacco Use  Smoking Status Never Smoker  Smokeless Tobacco Never Used    Goals Met:  Proper associated with RPD/PD & O2 Sat Independence with exercise equipment Improved SOB with ADL's Using PLB without cueing & demonstrates good technique Exercise tolerated well No report of cardiac concerns or symptoms Strength training completed today  Goals Unmet:  Not Applicable  Comments: Check out 1145.   Dr. Sinda Du is Medical Director for [facility] Pulmonary Rehab.

## 2019-08-20 ENCOUNTER — Other Ambulatory Visit: Payer: Self-pay

## 2019-08-20 ENCOUNTER — Encounter (HOSPITAL_COMMUNITY)
Admission: RE | Admit: 2019-08-20 | Discharge: 2019-08-20 | Disposition: A | Discharge status: Home / Self Care | Payer: No Typology Code available for payment source | Source: Ambulatory Visit | Attending: Internal Medicine | Admitting: Internal Medicine

## 2019-08-20 DIAGNOSIS — U071 COVID-19: Secondary | ICD-10-CM | POA: Diagnosis not present

## 2019-08-20 DIAGNOSIS — J1282 Pneumonia due to coronavirus disease 2019: Secondary | ICD-10-CM

## 2019-08-20 NOTE — Progress Notes (Signed)
Daily Session Note  Patient Details  Name: Jamie Burton MRN: [id number] Date of Birth: October 15, 1965 Referring Provider:     PULMONARY REHAB OTHER RESP ORIENTATION from 08/05/2019 in [place]  Referring Provider  [name]      Encounter Date: 08/20/2019  Check In: Session Check In - 08/20/19 1045      Check-In   Supervising physician immediately available to respond to emergencies  See telemetry face sheet for immediately available MD    Location  [facility]    Staff Present  [name], MS, EP, [facility], Exercise Physiologist;[name], RN, BSN    Virtual Visit  No    Medication changes reported      No    Fall or balance concerns reported     No    Tobacco Cessation  No Change    Warm-up and Cool-down  Performed as group-led instruction    Resistance Training Performed  Yes    VAD Patient?  No    PAD/SET Patient?  No      Pain Assessment   Currently in Pain?  No/denies    Pain Score  0-No pain    Multiple Pain Sites  No       Capillary Blood Glucose: No results found for this or any previous visit (from the past 24 hour(s)).    Social History   Tobacco Use  Smoking Status Never Smoker  Smokeless Tobacco Never Used    Goals Met:  Proper associated with RPD/PD & O2 Sat Independence with exercise equipment Improved SOB with ADL's Using PLB without cueing & demonstrates good technique Exercise tolerated well No report of cardiac concerns or symptoms Strength training completed today  Goals Unmet:  Not Applicable  Comments: Pt able to follow exercise prescription today without complaint.  Will continue to monitor for progression. Check out 1145.   Dr. Sinda Du is Medical Director for [facility] Pulmonary Rehab.

## 2019-08-22 ENCOUNTER — Encounter (HOSPITAL_COMMUNITY): Payer: No Typology Code available for payment source

## 2019-08-26 ENCOUNTER — Encounter: Payer: Self-pay | Admitting: *Deleted

## 2019-08-26 ENCOUNTER — Encounter: Payer: Self-pay | Admitting: Internal Medicine

## 2019-08-26 ENCOUNTER — Other Ambulatory Visit: Payer: Self-pay

## 2019-08-26 ENCOUNTER — Ambulatory Visit (INDEPENDENT_AMBULATORY_CARE_PROVIDER_SITE_OTHER): Payer: No Typology Code available for payment source | Admitting: Internal Medicine

## 2019-08-26 VITALS — BP 128/74 | HR 80 | Ht 65.0 in | Wt >= 6400 oz

## 2019-08-26 DIAGNOSIS — R0600 Dyspnea, unspecified: Secondary | ICD-10-CM

## 2019-08-26 DIAGNOSIS — J1282 Pneumonia due to coronavirus disease 2019: Secondary | ICD-10-CM

## 2019-08-26 DIAGNOSIS — J1289 Other viral pneumonia: Secondary | ICD-10-CM

## 2019-08-26 DIAGNOSIS — U071 COVID-19: Secondary | ICD-10-CM | POA: Diagnosis not present

## 2019-08-26 DIAGNOSIS — R7989 Other specified abnormal findings of blood chemistry: Secondary | ICD-10-CM | POA: Diagnosis not present

## 2019-08-26 DIAGNOSIS — R5381 Other malaise: Secondary | ICD-10-CM

## 2019-08-26 DIAGNOSIS — Z560 Unemployment, unspecified: Secondary | ICD-10-CM

## 2019-08-26 DIAGNOSIS — G4733 Obstructive sleep apnea (adult) (pediatric): Secondary | ICD-10-CM

## 2019-08-26 DIAGNOSIS — R0609 Other forms of dyspnea: Secondary | ICD-10-CM

## 2019-08-26 NOTE — Patient Instructions (Addendum)
Pneumonia due to COVID-19 virus Physical Deconditioning causing shortness of breath - improved with rehab but still not back to baseline  - last time you stopped 6 times walking 1 lap and this time you stopped only once  - too bad [place] [facility] is closing  plan - refer to pulm rehab at [facility] or [facility] - get HCRT supine and prone at [facility] next few weeks   -take appt sheet with you and give to your case manager - get PFT first available next few months - ok for covid-19 vaccine but understand that study was not done in people with prior covid and reinfection does not happen typically in first 3-6 months of covid  (what we know so far) -No escalation in inhaler therapy.  Elevated d-dimer and presumed PE at hospital (CT chest was not possible at hospital) - directly related to COVID-19 - improved late sept 2020  - continue anticoagulation for 6 months because of PE - minimum - check d-dimer in march 2021 - reassess further need for eliquis in march 2021   History of snoring Morbid obesity ([place]) Now confirmed sleep apnea  -glad you are on CPAP and night o2 since nov 2020  - continue care with Dr Jenetta Downer  WORK NOTE  -can go back to work but no lifting more than 5 or 10 pounds - no walking more than 150  feet in one stretch.  - monitor pulse ox as you walk  Followup  -march 2021 after PFT - get d-dimer at followup visit

## 2019-08-26 NOTE — Progress Notes (Signed)
OV 05/27/2019  Subjective:  Patient ID: Jamie Burton, female , DOB: 12-05-1965 , age 53 y.o. , MRN: [id number] , ADDRESS: [address] [place] [address]   05/27/2019 -   Chief Complaint  Patient presents with  . Consult    hospital follow-up.  pt treated for covid, pna.  pt has good and bad days, still experiencing sob with exertion.      HPI Jamie Burton 53 y.o. -presents to the pulmonary clinic for new evaluation.  There is a post COVID 19 survivor.  Review of the chart indicates that she was admitted early September 2020.  At this point in time she is over 21 days since her first symptom.  She was admitted for 5 or 6 days at [facility].  I discussed with the hospitalist Dr. Thedore Mins.  He clearly remembers the case.  He says she was extremely hypoxemic on arrival and had an extremely high d-dimer.  She was treated with Decadron and REMdesivir.  He said that because the D-dimers was extremely high pulmonary embolism was suspected but because of either renal function or CT scan unavailability in the duplex legs being of poor quality a pulmonary embolism was presumed and she was started on IV heparin full dose anticoagulation.  The next day she improved remarkably with her hypoxemia making them for the suspect pulmonary embolism.  She is then been discharged on 6 months to 3 months of Eliquis.  Patient is here for follow-up.  At this point in time she tells me that she is much better but she still has exertional dyspnea.  She also has severe exertional fatigue no cough.  Walking desaturation test 185 feet x 3 laps on room air: She was only able to complete 1 lap and even for this she had to stop 6 times.  She did not desaturate.  She says sometimes at home she desaturates.  She has extreme tiredness.  Since her last visit she saw her primary care physician.  I reviewed those notes.  CBC and chemistries are normal.  Her most recent chest x-ray at the time of discharge showed some  pulmonary infiltrates.   Her echo from the hospital is also reviewed.  She is here with the case manager and they had multiple questions.  All of which was answered.     ROS - per HPI  Results for Jamie, Burton (MRN [id number]) as of 05/27/2019 11:01  Ref. Range 04/30/2019 23:30 05/01/2019 08:55 05/02/2019 06:33 05/03/2019 03:02 05/04/2019 04:36 05/05/2019 04:41 9/28  D-Dimer, Quant Latest Ref Range: 0.00 - 0.50 ug/mL-FEU >20.00 (H) >20.00 (H) >20.00 (H) 19.29 (H) 12.70 (H) 9.90 (H) 1.11     ECHO 05/01/2019 1. The left ventricle has hyperdynamic systolic function, with an ejection fraction of >65%. The cavity size was normal. There is moderately increased left ventricular wall thickness. No evidence of left ventricular regional wall motion abnormalities.  2. The right ventricle has normal systolc function. The cavity was normal. There is no increase in right ventricular wall thickness. Right ventricular systolic pressure is normal with an estimated pressure of 36.0 mmHg.  3. The aortic valve was not well visualized.  4. Pulmonic valve regurgitation was not assessed by color flow Doppler.  FINDINGS  Left Ventricle: The left ventricle has hyperdynamic systolic function, with an ejection fraction of >65%. The cavity size was normal. There is moderately increased left ventricular wall thickness. No evidence of left ventricular regional wall motion  abnormalities.  Right Ventricle: The right ventricle has normal systolic function. The cavity was normal. There is no increase in right ventricular wall thickness. Right ventricular systolic pressure is normal with an estimated pressure of 36.0 mmHg.   has a past medical history of Arthritis, Dyspnea, Heart murmur, Hyperlipidemia, Hypertension, Pneumonia due to COVID-19 virus (04/30/2019), and Pre-diabetes.   reports that she has never smoked. She has never used smokeless tobacco.  No past surgical history on file.  No Known Allergies   There is no  immunization history on file for this patient.  OV 08/26/2019  Subjective:  Patient ID: Jamie Burton, female , DOB: July 17, 1966 , age 24 y.o. , MRN: [id number] , ADDRESS: [address] [place] [address]   08/26/2019 -   Chief Complaint  Patient presents with  . Follow-up    Pt states she has been doing well since last visit and denies any complaints.   Follow-up post Covid  HPI Jamie Burton 53 y.o. -presents with her case Web designer.  She tells me that since her last visit in September 2020 her shortness of breath is improved but still not back to baseline.  Back in September 2020 when I walked 185 feet x 3 laps.  She stopped 6 times even in the first lap although she did not desaturate.  Since then she has been attending approximately 6 glasses of rehabilitation.  Today she walked 185 feet x 3 laps and she stopped only 1 time of the first lap..  She almost walked 150-170 feet without stopping.  This is an improvement.  Nevertheless she is not back to baseline.  She continues on Eliquis for her presumed pulmonary embolism.  Her D-dimer in September 2020 was significantly improved but still elevated at 1.1.  She has not been diagnosed with sleep apnea and she is on CPAP under Dr. Val Eagle with nighttime oxygen.  This is been going on since September 2020.  She feels she can go to work but she still wants to attend pulmonary rehabilitation and she still not back to baseline.  The case manager is saying okay to go to work but wants me to specify restrictions.  She is interested in the high-resolution CT chest after our discussion based on the fact that she is still short of breath.  She had questions about the COVID-19 vaccine.  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   Of note she is on albuterol inhaler.  She saw the pharmacist.  Who thought that maybe she needs to be on a schedule inhaler but she does not have any formal diagnosis of asthma.   She says the inhaler does not necessarily help her.  She gets short of breath with exertion and then relieved by rest.  We discussed and we agreed we would just leave her on albuterol inhaler and after pulmonary function test to reassess if she needs to be on any inhaler at all. ROS - per HPI     has a past medical history of Arthritis, Dyspnea, Heart murmur, Hyperlipidemia, Hypertension, Pneumonia due to COVID-19 virus (04/30/2019), and Pre-diabetes.   reports that she has never smoked. She has never used smokeless tobacco.  No past surgical history on file.  No Known Allergies  Immunization History  Administered Date(s) Administered  . Influenza,inj,Quad PF,6+ Mos 05/27/2019    Family History  Problem Relation Age of Onset  . Hypertension Other      Current Outpatient Medications:  .  albuterol (VENTOLIN HFA) 108 (90 Base) MCG/ACT inhaler, Inhale 2 puffs into the lungs every 6 (six) hours as needed for wheezing or shortness of breath., Disp: 6.7 g, Rfl: 0 .  amLODipine (NORVASC) 10 MG tablet, Take 1 tablet (10 mg total) by mouth daily., Disp: 30 tablet, Rfl: 0 .  apixaban (ELIQUIS) 5 MG TABS tablet, Take 1 tablet (5 mg total) by mouth 2 (two) times daily., Disp: 60 tablet, Rfl: 0 .  atenolol (TENORMIN) 50 MG tablet, Take 50 mg by mouth daily., Disp: , Rfl:  .  hydrochlorothiazide (HYDRODIURIL) 25 MG tablet, Take 25 mg by mouth daily., Disp: , Rfl:  .  lovastatin (MEVACOR) 20 MG tablet, Take 40 mg by mouth daily. , Disp: , Rfl:       Objective:   Vitals:   08/26/19 1026  BP: 128/74  Pulse: 80  SpO2: 98%  Weight: (!) 422 lb 9.6 oz (191.7 kg)  Height: 5\' 5"  (1.651 m)    Estimated body mass index is 70.32 kg/m as calculated from the following:   Height as of this encounter: 5\' 5"  (1.651 m).   Weight as of this encounter: 422 lb 9.6 oz (191.7 kg).  @WEIGHTCHANGE @  [employer]   08/26/19 1026  Weight: (!) 422 lb 9.6 oz (191.7 kg)     Physical Exam  General  Appearance:    Alert, cooperative, no distress, appears stated age - yes , Deconditioned looking - no , OBESE  - yes, Sitting on Wheelchair -  no  Head:    Normocephalic, without obvious abnormality, atraumatic  Eyes:    PERRL, conjunctiva/corneas clear,  Ears:    Normal TM's and external ear canals, both ears  Nose:   Nares normal, septum midline, mucosa normal, no drainage    or sinus tenderness. OXYGEN ON  - no . Patient is @ ra   Throat:   Lips, mucosa, and tongue normal; teeth and gums normal. Cyanosis on lips - no  Neck:   Supple, symmetrical, trachea midline, no adenopathy;    thyroid:  no enlargement/tenderness/nodules; no carotid   bruit or JVD  Back:     Symmetric, no curvature, ROM normal, no CVA tenderness  Lungs:     Distress - no , Wheeze no, Barrell Chest - no, Purse lip breathing - no, Crackles - no   Chest Wall:    No tenderness or deformity.    Heart:    Regular rate and rhythm, S1 and S2 normal, no rub   or gallop, Murmur - no  Breast Exam:    NOT DONE  Abdomen:     Soft, non-tender, bowel sounds active all four quadrants,    no masses, no organomegaly. Visceral obesity - yes  Genitalia:   NOT DONE  Rectal:   NOT DONE  Extremities:   Extremities - normal, Has Cane - no, Clubbing - no, Edema - no  Pulses:   2+ and symmetric all extremities  Skin:   Stigmata of Connective Tissue Disease - no  Lymph nodes:   Cervical, supraclavicular, and axillary nodes normal  Psychiatric:  Neurologic:   Pleasant - yes, Anxious - no, Flat affect - no  CAm-ICU - neg, Alert and Oriented x 3 - yes, Moves all 4s - yes, Speech - normal, Cognition - intact           Assessment:     No diagnosis found.     Plan:     Patient Instructions  Pneumonia due to  COVID-19 virus Physical Deconditioning causing shortness of breath - improved with rehab but still not back to baseline  - last time you stopped 6 times walking 1 lap and this time you stopped only once  - too bad [place]  [facility] is closing  plan - [facility] - get HCRT supine and prone at [facility] next few weeks   -take appt sheet with you and give to your case manager - get PFT first available next few months - ok for covid-19 vaccine but understand that study was not done in people with prior covid and reinfection does not happen typically in first 3-6 months of covid  (what we know so far)   Elevated d-dimer and presumed PE at hospital (CT chest was not possible at hospital) - directly related to COVID-19 - improved late sept 2020  - continue anticoagulation for 6 months because of PE - minimum - check d-dimer in march 2021 - reassess further need for eliquis in march 2021   History of snoring Morbid obesity ([facility]) Now confirmed sleep apnea  -glad you are on CPAP and night o2 since nov 2020  - continue care with Dr Val Eagle  WORK NOTE  -can go back to work but no lifting more than 5 or 10 pounds - no walking more than 150  feet in one stretch.  - monitor pulse ox as you walk  Followup  -march 2021 after PFT - get d-dimer at followup visit   > 50% of this > 25 min visit spent in  face to face counseling or coordination of care - by this undersigned MD - Dr Kalman ShanMurali Briona Korpela. This includes one or more of the following documented above: discussion of test results, diagnostic or treatment recommendations, prognosis, risks and benefits of management options, instructions, education, compliance or risk-factor reduction    SIGNATURE    Dr. Kalman ShanMurali Onelia Cadmus, M.D., F.C.C.P,  Pulmonary and Critical Care Medicine Staff Physician, [facility] Center Director - Interstitial Lung Disease  Program  Pulmonary Fibrosis [facility] at [facility] [place], [place], [id number]  Pager: [phone], If no answer or between  15:00h - 7:00h: call [phone] Telephone: [phone]  11:27 AM 08/26/2019

## 2019-08-27 ENCOUNTER — Encounter (HOSPITAL_COMMUNITY): Payer: No Typology Code available for payment source

## 2019-08-29 ENCOUNTER — Encounter (HOSPITAL_COMMUNITY): Payer: No Typology Code available for payment source

## 2019-09-02 ENCOUNTER — Telehealth (HOSPITAL_COMMUNITY): Payer: Self-pay

## 2019-09-02 NOTE — Telephone Encounter (Signed)
Called patient regarding Pulmonary Rehab closure. Patient said she was back at work and trying to walk as much as she can. She is not interested in the virtual program but would like [place] to call her when re reopen. Will follow up when we reopen.

## 2019-09-03 ENCOUNTER — Encounter (HOSPITAL_COMMUNITY): Payer: No Typology Code available for payment source

## 2019-09-05 ENCOUNTER — Encounter (HOSPITAL_COMMUNITY): Payer: No Typology Code available for payment source

## 2019-09-06 NOTE — Progress Notes (Signed)
Pulmonary Individual Treatment Plan  Patient Details  Name: Jamie Burton MRN: [id number] Date of Birth: February 23, 1966 Referring Provider:     PULMONARY REHAB OTHER RESP OR[facility]ATION [facility]  Referring Provider  [name]      Initial Encounter Date:    PULMONARY REHAB OTHER RESP ORIENTATION from 08/05/2019 in [facility]  Date  08/05/19      Visit Diagnosis: Pneumonia due to COVID-19 virus  Patient's Home Medications on Admission:   Current Outpatient Medications:  .  albuterol (VENTOLIN HFA) 108 (90 Base) MCG/ACT inhaler, Inhale 2 puffs into the lungs every 6 (six) hours as needed for wheezing or shortness of breath., Disp: 6.7 g, Rfl: 0 .  amLODipine (NORVASC) 10 MG tablet, Take 1 tablet (10 mg total) by mouth daily., Disp: 30 tablet, Rfl: 0 .  apixaban (ELIQUIS) 5 MG TABS tablet, Take 1 tablet (5 mg total) by mouth 2 (two) times daily., Disp: 60 tablet, Rfl: 0 .  atenolol (TENORMIN) 50 MG tablet, Take 50 mg by mouth daily., Disp: , Rfl:  .  hydrochlorothiazide (HYDRODIURIL) 25 MG tablet, Take 25 mg by mouth daily., Disp: , Rfl:  .  lovastatin (MEVACOR) 20 MG tablet, Take 40 mg by mouth daily. , Disp: , Rfl:   Past Medical History: Past Medical History:  Diagnosis Date  . Arthritis   . Dyspnea   . Heart murmur    told she had a heart murmur as a child but unsure now  . Hyperlipidemia   . Hypertension   . Pneumonia due to COVID-19 virus 04/30/2019  . Pre-diabetes     Tobacco Use: Social History   Tobacco Use  Smoking Status Never Smoker  Smokeless Tobacco Never Used    Labs: Recent Review Flowsheet Data    Labs for ITP Cardiac and Pulmonary Rehab Latest Ref Rng & Units 05/01/2019 05/02/2019   Cholestrol 0 - 200 mg/dL - 865187   LDLCALC 0 - 99 mg/dL - 784(O127(H)   HDL >96>40 mg/dL - 29(B36(L)   Trlycerides <284<150 mg/dL - 132122   Hemoglobin G4WA1c 4.8 - 5.6 % 6.6(H) -      Capillary Blood Glucose: Lab Results   Component Value Date   GLUCAP 137 (H) 05/05/2019   GLUCAP 176 (H) 05/04/2019   GLUCAP 240 (H) 05/04/2019   GLUCAP 205 (H) 05/04/2019   GLUCAP 145 (H) 05/04/2019     Pulmonary Assessment Scores: Pulmonary Assessment Scores    Row Name 08/05/19 1138         ADL UCSD   ADL Phase  Entry     SOB Score total  48     Rest  0     Walk  10     Stairs  3     Bath  3     Dress  3     Shop  3       CAT Score   CAT Score  9       mMRC Score   mMRC Score  3       UCSD: Self-administered rating of dyspnea associated with activities of daily living (ADLs) 6-point scale (0 = "not at all" to 5 = "maximal or unable to do because of breathlessness")  Scoring Scores range from 0 to 120.  Minimally important difference is 5 units  CAT: CAT can identify the health impairment of COPD patients and is better correlated with disease progression.  CAT has a scoring range of  zero to 40. The CAT score is classified into four groups of low (less than 10), medium (10 - 20), high (21-30) and very high (31-40) based on the impact level of disease on health status. A CAT score over 10 suggests significant symptoms.  A worsening CAT score could be explained by an exacerbation, poor medication adherence, poor inhaler technique, or progression of COPD or comorbid conditions.  CAT MCID is 2 points  mMRC: mMRC (Modified Medical Research Council) Dyspnea Scale is used to assess the degree of baseline functional disability in patients of respiratory disease due to dyspnea. No minimal important difference is established. A decrease in score of 1 point or greater is considered a positive change.   Pulmonary Function Assessment:   Exercise Target Goals: Exercise Program Goal: Individual exercise prescription set using results from initial 6 min walk test and THRR while considering  patient's activity barriers and safety.   Exercise Prescription Goal: Initial exercise prescription builds to 30-45 minutes a  day of aerobic activity, 2-3 days per week.  Home exercise guidelines will be given to patient during program as part of exercise prescription that the participant will acknowledge.  Activity Barriers & Risk Stratification:   6 Minute Walk: 6 Minute Walk    Row Name 08/05/19 1007         6 Minute Walk   Phase  Initial     Distance  800 feet     Walk Time  6 minutes     # of Rest Breaks  5 rest-1:21, 2:10, 10 seconds, 22 seconds, 10 seconds     MPH  1.51     METS  2.16     RPE  12     Perceived Dyspnea   16     VO2 Peak  2.84     Symptoms  Yes (comment)     Comments  Patient was very short of breath, had to stop to rest 5 times. Complained of bilateral knee pain 3/10. Knee pain gone after 2 minutes rest.     Resting HR  64 bpm     Resting BP  108/68     Resting Oxygen Saturation   92 %     Exercise Oxygen Saturation  during 6 min walk  89 %     Max Ex. HR  101 bpm     Max Ex. BP  138/110     2 Minute Post BP  128/78        Oxygen Initial Assessment: Oxygen Initial Assessment - 08/05/19 1136      Home Oxygen   Home Oxygen Device  None    Sleep Oxygen Prescription  CPAP    Liters per minute  2    Home Exercise Oxygen Prescription  None    Home at Rest Exercise Oxygen Prescription  None    Compliance with Home Oxygen Use  Yes      Initial 6 min Walk   Oxygen Used  None      Program Oxygen Prescription   Program Oxygen Prescription  None       Oxygen Re-Evaluation:   Oxygen Discharge (Final Oxygen Re-Evaluation):   Initial Exercise Prescription: Initial Exercise Prescription - 08/05/19 1100      Date of Initial Exercise RX and Referring Provider   Date  08/05/19    Referring Provider  [name]    Expected Discharge Date  10/05/18      T5 Nustep   Level  1  SPM  60    Minutes  22    METs  1.4      Track   Laps  6    Minutes  17    METs  2.74      Prescription Details   Frequency (times per week)  2    Duration  Progress to 30 minutes of  continuous aerobic without signs/symptoms of physical distress      Intensity   THRR 40-80% of Max Heartrate  [phone]    Ratings of Perceived Exertion  11-13    Perceived Dyspnea  0-4      Progression   Progression  Continue to progress workloads to maintain intensity without signs/symptoms of physical distress.      Resistance Training   Training Prescription  Yes    Weight  1    Reps  10-15       Perform Capillary Blood Glucose checks as needed.  Exercise Prescription Changes: Exercise Prescription Changes    Row Name 08/08/19 0900             Response to Exercise   Blood Pressure (Admit)  138/84       Blood Pressure (Exercise)  140/82       Blood Pressure (Exit)  110/60       Heart Rate (Admit)  73 bpm       Heart Rate (Exercise)  90 bpm       Heart Rate (Exit)  74 bpm       Oxygen Saturation (Admit)  98 %       Oxygen Saturation (Exercise)  94 %       Oxygen Saturation (Exit)  94 %       Rating of Perceived Exertion (Exercise)  12       Perceived Dyspnea (Exercise)  14       Symptoms  extreme SOB       Comments  morbid obesity       Duration  Continue with 30 min of aerobic exercise without signs/symptoms of physical distress.       Intensity  THRR unchanged         Progression   Progression  Continue to progress workloads to maintain intensity without signs/symptoms of physical distress.       Average METs  2.07         Resistance Training   Training Prescription  Yes       Weight  1       Reps  10-15         T5 Nustep   Level  1       SPM  52       Minutes  22       METs  1.7         Track   Laps  5       Minutes  17       METs  2.45         Home Exercise Plan   Plans to continue exercise at  Home (comment)       Frequency  Add 3 additional days to program exercise sessions.       Initial Home Exercises Provided  08/05/19          Exercise Comments: Exercise Comments    Row Name 08/08/19 5027           Exercise Comments  Jamie Burton  is only on her 2nd visit. She  is having to walk the track for her 1st exercise modality and then the Nustep. Will progress her as tolerated.          Exercise Goals and Review: Exercise Goals    Row Name 08/05/19 1020             Exercise Goals   Increase Physical Activity  Yes       Intervention  Provide advice, education, support and counseling about physical activity/exercise needs.;Develop an individualized exercise prescription for aerobic and resistive training based on initial evaluation findings, risk stratification, comorbidities and participant's personal goals.       Expected Outcomes  Short Term: Attend rehab on a regular basis to increase amount of physical activity.;Long Term: Add in home exercise to make exercise part of routine and to increase amount of physical activity.;Long Term: Exercising regularly at least 3-5 days a week.       Increase Strength and Stamina  Yes       Intervention  Provide advice, education, support and counseling about physical activity/exercise needs.;Develop an individualized exercise prescription for aerobic and resistive training based on initial evaluation findings, risk stratification, comorbidities and participant's personal goals.       Expected Outcomes  Short Term: Increase workloads from initial exercise prescription for resistance, speed, and METs.;Short Term: Perform resistance training exercises routinely during rehab and add in resistance training at home;Long Term: Improve cardiorespiratory fitness, muscular endurance and strength as measured by increased METs and functional capacity (6MWT)       Able to understand and use rate of perceived exertion (RPE) scale  Yes       Intervention  Provide education and explanation on how to use RPE scale       Expected Outcomes  Short Term: Able to use RPE daily in rehab to express subjective intensity level;Long Term:  Able to use RPE to guide intensity level when exercising independently       Able  to understand and use Dyspnea scale  Yes       Intervention  Provide education and explanation on how to use Dyspnea scale       Expected Outcomes  Short Term: Able to use Dyspnea scale daily in rehab to express subjective sense of shortness of breath during exertion;Long Term: Able to use Dyspnea scale to guide intensity level when exercising independently       Knowledge and understanding of Target Heart Rate Range (THRR)  Yes       Intervention  Provide education and explanation of THRR including how the numbers were predicted and where they are located for reference       Expected Outcomes  Short Term: Able to state/look up THRR;Long Term: Able to use THRR to govern intensity when exercising independently;Short Term: Able to use daily as guideline for intensity in rehab       Able to check pulse independently  Yes       Intervention  Provide education and demonstration on how to check pulse in carotid and radial arteries.;Review the importance of being able to check your own pulse for safety during independent exercise       Expected Outcomes  Short Term: Able to explain why pulse checking is important during independent exercise;Long Term: Able to check pulse independently and accurately       Understanding of Exercise Prescription  Yes       Intervention  Provide education, explanation, and written materials on patient's individual exercise prescription  Expected Outcomes  Short Term: Able to explain program exercise prescription;Long Term: Able to explain home exercise prescription to exercise independently          Exercise Goals Re-Evaluation : Exercise Goals Re-Evaluation    Row Name 08/08/19 0930             Exercise Goal Re-Evaluation   Exercise Goals Review  Increase Physical Activity;Increase Strength and Stamina       Comments  Jamie Burton's goals are to breath better and to lose 15lbs.       Expected Outcomes  to reach her goals on or before graduation          Discharge  Exercise Prescription (Final Exercise Prescription Changes): Exercise Prescription Changes - 08/08/19 0900      Response to Exercise   Blood Pressure (Admit)  138/84    Blood Pressure (Exercise)  140/82    Blood Pressure (Exit)  110/60    Heart Rate (Admit)  73 bpm    Heart Rate (Exercise)  90 bpm    Heart Rate (Exit)  74 bpm    Oxygen Saturation (Admit)  98 %    Oxygen Saturation (Exercise)  94 %    Oxygen Saturation (Exit)  94 %    Rating of Perceived Exertion (Exercise)  12    Perceived Dyspnea (Exercise)  14    Symptoms  extreme SOB    Comments  morbid obesity    Duration  Continue with 30 min of aerobic exercise without signs/symptoms of physical distress.    Intensity  THRR unchanged      Progression   Progression  Continue to progress workloads to maintain intensity without signs/symptoms of physical distress.    Average METs  2.07      Resistance Training   Training Prescription  Yes    Weight  1    Reps  10-15      T5 Nustep   Level  1    SPM  52    Minutes  22    METs  1.7      Track   Laps  5    Minutes  17    METs  2.45      Home Exercise Plan   Plans to continue exercise at  Home (comment)    Frequency  Add 3 additional days to program exercise sessions.    Initial Home Exercises Provided  08/05/19       Nutrition:  Target Goals: Understanding of nutrition guidelines, daily intake of sodium 1500mg , cholesterol 200mg , calories 30% from fat and 7% or less from saturated fats, daily to have 5 or more servings of fruits and vegetables.  Biometrics: Pre Biometrics - 08/05/19 1015      Pre Biometrics   Waist Circumference  56 inches    Hip Circumference  65 inches    Waist to Hip Ratio  0.86 %    Triceps Skinfold  80 mm    % Body Fat  74.3 %    Grip Strength  11.4 kg    Flexibility  0 in    Single Leg Stand  7.33 seconds        Nutrition Therapy Plan and Nutrition Goals: Nutrition Therapy & Goals - 08/05/19 1141      Personal Nutrition  Goals   Personal Goal #2  Patient has been encourage to eat a more heart healthy diet to promote weight lose. Handout given on how to make heart healthy food choices.  Additional Goals?  No      Intervention Plan   Intervention  Nutrition handout(s) given to patient.       Nutrition Assessments: Nutrition Assessments - 08/05/19 1142      MEDFICTS Scores   Pre Score  65       Nutrition Goals Re-Evaluation:   Nutrition Goals Discharge (Final Nutrition Goals Re-Evaluation):   Psychosocial: Target Goals: Acknowledge presence or absence of significant depression and/or stress, maximize coping skills, provide positive support system. Participant is able to verbalize types and ability to use techniques and skills needed for reducing stress and depression.  Initial Review & Psychosocial Screening: Initial Psych Review & Screening - 08/05/19 1139      Initial Review   Current issues with  None Identified;Current Depression   A little down because she is not able to work right now.     Family Dynamics   Good Support System?  Yes      Barriers   Psychosocial barriers to participate in program  There are no identifiable barriers or psychosocial needs.      Screening Interventions   Interventions  Encouraged to exercise    Expected Outcomes  Short Term goal: Identification and review with participant of any Quality of Life or Depression concerns found by scoring the questionnaire.;Long Term goal: The participant improves quality of Life and PHQ9 Scores as seen by post scores and/or verbalization of changes       Quality of Life Scores: Quality of Life - 08/05/19 1020      Quality of Life   Select  Quality of Life      Quality of Life Scores   Health/Function Pre  20.75 %    Socioeconomic Pre  20.75 %    Psych/Spiritual Pre  21 %    Family Pre  20.8 %    GLOBAL Pre  20.81 %      Scores of 19 and below usually indicate a poorer quality of life in these areas.  A  difference of  2-3 points is a clinically meaningful difference.  A difference of 2-3 points in the total score of the Quality of Life Index has been associated with significant improvement in overall quality of life, self-image, physical symptoms, and general health in studies assessing change in quality of life.   PHQ-9: Recent Review Flowsheet Data    Depression screen [facility] 2/9 08/05/2019   Decreased Interest 0   Down, Depressed, Hopeless 1   PHQ - 2 Score 1   Altered sleeping 2   Tired, decreased energy 1   Change in appetite 1   Feeling bad or failure about yourself  0   Trouble concentrating 0   Moving slowly or fidgety/restless 0   Suicidal thoughts 0   PHQ-9 Score 5   Difficult doing work/chores Not difficult at all     Interpretation of Total Score  Total Score Depression Severity:  1-4 = Minimal depression, 5-9 = Mild depression, 10-14 = Moderate depression, 15-19 = Moderately severe depression, 20-27 = Severe depression   Psychosocial Evaluation and Intervention: Psychosocial Evaluation - 08/05/19 1140      Psychosocial Evaluation & Interventions   Interventions  Encouraged to exercise with the program and follow exercise prescription    Continue Psychosocial Services   No Follow up required       Psychosocial Re-Evaluation:   Psychosocial Discharge (Final Psychosocial Re-Evaluation):    Education: Education Goals: Education classes will be provided on a weekly basis,  covering required topics. Participant will state understanding/return demonstration of topics presented.  Learning Barriers/Preferences: Learning Barriers/Preferences - 08/05/19 1024      Learning Barriers/Preferences   Learning Barriers  None    Learning Preferences  Pictoral;Video;Written Material;Computer/Internet       Education Topics: How Lungs Work and Diseases: - Discuss the anatomy of the lungs and diseases that can affect the lungs, such as COPD.   Exercise: -Discuss the  importance of exercise, FITT principles of exercise, normal and abnormal responses to exercise, and how to exercise safely.   Environmental Irritants: -Discuss types of environmental irritants and how to limit exposure to environmental irritants.   Meds/Inhalers and oxygen: - Discuss respiratory medications, definition of an inhaler and oxygen, and the proper way to use an inhaler and oxygen.   Energy Saving Techniques: - Discuss methods to conserve energy and decrease shortness of breath when performing activities of daily living.    Bronchial Hygiene / Breathing Techniques: - Discuss breathing mechanics, pursed-lip breathing technique,  proper posture, effective ways to clear airways, and other functional breathing techniques   Cleaning Equipment: - Provides group verbal and written instruction about the health risks of elevated stress, cause of high stress, and healthy ways to reduce stress.   Nutrition I: Fats: - Discuss the types of cholesterol, what cholesterol does to the body, and how cholesterol levels can be controlled.   Nutrition II: Labels: -Discuss the different components of food labels and how to read food labels.   Respiratory Infections: - Discuss the signs and symptoms of respiratory infections, ways to prevent respiratory infections, and the importance of seeking medical treatment when having a respiratory infection.   Stress I: Signs and Symptoms: - Discuss the causes of stress, how stress may lead to anxiety and depression, and ways to limit stress.   CARDIAC REHAB PHASE II EXERCISE from [facility]/2019 in [facility]  Date  08/08/19  Educator  DC  Instruction Review Code  2- Demonstrated Understanding      Stress II: Relaxation: -Discuss relaxation techniques to limit stress.   [facility]AB [facility]  Date  08/15/19  Educator  D. Coad  Instruction Review Code  2-  Demonstrated Understanding      Oxygen for Home/Travel: - Discuss how to prepare for travel when on oxygen and proper ways to transport and store oxygen to ensure safety.   Knowledge Questionnaire Score: Knowledge Questionnaire Score - 08/05/19 1143      Knowledge Questionnaire Score   Pre Score  12/18       Core Components/Risk Factors/Patient Goals at Admission: Personal Goals and Risk Factors at Admission - 08/05/19 1145      Core Components/Risk Factors/Patient Goals on Admission    Weight Management  Yes    Intervention  Weight Management/Obesity: Establish reasonable short term and long term weight goals.    Admit Weight  416 lb 9.6 oz (189 kg)    Goal Weight: Short Term  409 lb 1.6 oz (185.6 kg)    Goal Weight: Long Term  401 lb 9.6 oz (182.2 kg)    Expected Outcomes  Short Term: Continue to assess and modify interventions until short term weight is achieved;Long Term: Adherence to nutrition and physical activity/exercise program aimed toward attainment of established weight goal    Improve shortness of breath with ADL's  Yes    Intervention  Provide education, individualized exercise plan and daily activity instruction to help  decrease symptoms of SOB with activities of daily living.    Expected Outcomes  Short Term: Improve cardiorespiratory fitness to achieve a reduction of symptoms when performing ADLs;Long Term: Be able to perform more ADLs without symptoms or delay the onset of symptoms    Personal Goal Other  Yes    Personal Goal  To breath better and to lose 15lbs.    Intervention  Attend program 2 x week and to supplement with home exercise plan 3 x week.    Expected Outcomes  to reach short term and long term goals.       Core Components/Risk Factors/Patient Goals Review:    Core Components/Risk Factors/Patient Goals at Discharge (Final Review):    ITP Comments: ITP Comments    Row Name 09/06/19 1350           ITP Comments  Pulmonary rehab has been  suspended due to department closing and reassignment of staff secondary to COVID surge. Patient wants to return and complete the program when we reopen. She has completed 6 sessions. She is not interested in the [facility] virtual program. She says she plans to continue walking all she can until we reopen. Will f/u when we reopen.          Comments: ITP REVIEW Pulmonary rehab has been suspended due to department closing and reassignment of staff secondary to COVID surge. Patient wants to return and complete the program when we reopen. She has completed 6 sessions. She is not interested in the [facility] virtual program. She says she plans to continue walking all she can until we reopen. Will f/u when we reopen.

## 2019-09-10 ENCOUNTER — Encounter (HOSPITAL_COMMUNITY): Payer: No Typology Code available for payment source

## 2019-09-12 ENCOUNTER — Encounter (HOSPITAL_COMMUNITY): Payer: No Typology Code available for payment source

## 2019-09-17 ENCOUNTER — Encounter (HOSPITAL_COMMUNITY): Payer: No Typology Code available for payment source

## 2019-09-18 ENCOUNTER — Ambulatory Visit (INDEPENDENT_AMBULATORY_CARE_PROVIDER_SITE_OTHER): Payer: No Typology Code available for payment source | Admitting: Pulmonary Disease

## 2019-09-18 ENCOUNTER — Encounter: Payer: Self-pay | Admitting: Pulmonary Disease

## 2019-09-18 ENCOUNTER — Other Ambulatory Visit: Payer: Self-pay

## 2019-09-18 VITALS — BP 132/70 | HR 72 | Temp 97.3°F | Ht 65.0 in | Wt >= 6400 oz

## 2019-09-18 DIAGNOSIS — Z9989 Dependence on other enabling machines and devices: Secondary | ICD-10-CM | POA: Diagnosis not present

## 2019-09-18 DIAGNOSIS — G4733 Obstructive sleep apnea (adult) (pediatric): Secondary | ICD-10-CM | POA: Diagnosis not present

## 2019-09-18 NOTE — Progress Notes (Signed)
Subjective:    Patient ID: Jamie Burton, female    DOB: 02-May-1966, 54 y.o.   MRN: [id number]  Patient with severe obstructive sleep apnea  Has been using CPAP on a regular basis Took a while to get used to it but she is not used to using it on a regular basis Feels better generally with CPAP use  Did have some mask issues in the past but this is resolved  Sleeps well Feels restored whenever she wakes up in the morning  Admits to sweating at night No palpitations No choking episodes  Usually goes to bed between 9 PM and 11 PM Takes about 30 minutes for an hour to fall asleep Final awakening time about 6:30 AM Wakes up 2-3 times during the night-nonspecific reasons  No family history of obstructive sleep apnea  Past Medical History:  Diagnosis Date  . Arthritis   . Dyspnea   . Heart murmur    told she had a heart murmur as a child but unsure now  . Hyperlipidemia   . Hypertension   . Pneumonia due to COVID-19 virus 04/30/2019  . Pre-diabetes    Social History   Socioeconomic History  . Marital status: Single    Spouse name: Not on file  . Number of children: 1  . Years of education: Not on file  . Highest education level: Not on file  Occupational History  . Not on file  Tobacco Use  . Smoking status: Never Smoker  . Smokeless tobacco: Never Used  Substance and Sexual Activity  . Alcohol use: Never  . Drug use: Never  . Sexual activity: Not Currently    Birth control/protection: I.U.D.  Other Topics Concern  . Not on file  Social History Narrative  . Not on file   Social Determinants of Health   Financial Resource Strain: Low Risk   . Difficulty of Paying Living Expenses: Not hard at all  Food Insecurity: No Food Insecurity  . Worried About Programme researcher, broadcasting/film/video in the Last Year: Never true  . Ran Out of Food in the Last Year: Never true  Transportation Needs: No Transportation Needs  . Lack of Transportation (Medical): No  . Lack of  Transportation (Non-Medical): No  Physical Activity: Inactive  . Days of Exercise per Week: 0 days  . Minutes of Exercise per Session: 0 min  Stress: No Stress Concern Present  . Feeling of Stress : Not at all  Social Connections: Unknown  . Frequency of Communication with Friends and Family: More than three times a week  . Frequency of Social Gatherings with Friends and Family: More than three times a week  . Attends Religious Services: More than 4 times per year  . Active Member of Clubs or Organizations: Yes  . Attends Banker Meetings: More than 4 times per year  . Marital Status: Not on file  Intimate Partner Violence:   . Fear of Current or Ex-Partner: Not on file  . Emotionally Abused: Not on file  . Physically Abused: Not on file  . Sexually Abused: Not on file   Family History  Problem Relation Age of Onset  . Hypertension Other    Review of Systems  Constitutional: Negative for fever and unexpected weight change.  HENT: Negative for congestion, dental problem, ear pain, nosebleeds, postnasal drip, rhinorrhea, sinus pressure, sneezing, sore throat and trouble swallowing.   Eyes: Negative for redness and itching.  Respiratory: Positive for shortness of breath. Negative for  cough, chest tightness and wheezing.   Cardiovascular: Positive for leg swelling. Negative for palpitations.  Gastrointestinal: Negative for nausea and vomiting.  Genitourinary: Negative for dysuria.  Musculoskeletal: Negative for joint swelling.  Skin: Negative for rash.  Allergic/Immunologic: Negative.  Negative for environmental allergies, food allergies and immunocompromised state.  Neurological: Negative for headaches.  Hematological: Bruises/bleeds easily.  Psychiatric/Behavioral: Negative for dysphoric mood. The patient is not nervous/anxious.       Objective:   Physical Exam Constitutional:      Appearance: She is obese.  HENT:     Head: Normocephalic.     Nose: Nose normal.      Mouth/Throat:     Mouth: Mucous membranes are moist.     Comments: Mallampati 4, crowded oropharynx Eyes:     General:        Right eye: No discharge.        Left eye: No discharge.     Pupils: Pupils are equal, round, and reactive to light.  Neck:     Comments: Neck is thick Cardiovascular:     Rate and Rhythm: Normal rate and regular rhythm.     Heart sounds: No murmur. No friction rub.  Pulmonary:     Effort: Pulmonary effort is normal. No respiratory distress.     Breath sounds: Normal breath sounds. No stridor. No wheezing or rhonchi.  Musculoskeletal:     Cervical back: Normal range of motion. No rigidity.  Neurological:     Mental Status: She is alert.    Vitals:   09/18/19 1629  BP: 132/70  Pulse: 72  Temp: (!) 97.3 F (36.3 C)  SpO2: 96%   Results of the Epworth flowsheet 06/20/2019  Sitting and reading 2  Watching TV 3  Sitting, inactive in a public place (e.g. a theatre or a meeting) 1  As a passenger in a car for an hour without a break 1  Lying down to rest in the afternoon when circumstances permit 1  Sitting and talking to someone 0  Sitting quietly after a lunch without alcohol 0  In a car, while stopped for a few minutes in traffic 0  Total score 8  Compliance data reveals 100% compliance AHI of 6.8 Machine setting 5-20    Assessment & Plan:  Severe obstructive sleep apnea .  Compliant with CPAP use .  Encouraged to use CPAP nightly  Morbid obesity .  Regular exercise encouraged. . Importance of diet and exercise discussed  Excessive daytime sleepiness .  Feels better  Hypertension .  Controlled at present  Pathophysiology of sleep disordered breathing discussed with the patient  Treatment options for sleep disordered breathing discussed with the patient Plan:  Continue regular CPAP use  Encouraged about regular exercises and weight loss efforts  I will see her back in 6 months

## 2019-09-18 NOTE — Patient Instructions (Signed)
Severe obstructive sleep apnea adequately treated with CPAP therapy  Continue using CPAP on a nightly basis  Regular exercise as tolerated  I will see you back in 6 months

## 2019-09-19 ENCOUNTER — Encounter (HOSPITAL_COMMUNITY): Payer: No Typology Code available for payment source

## 2019-09-24 ENCOUNTER — Encounter (HOSPITAL_COMMUNITY): Payer: No Typology Code available for payment source

## 2019-09-26 ENCOUNTER — Encounter (HOSPITAL_COMMUNITY): Payer: No Typology Code available for payment source

## 2019-09-30 ENCOUNTER — Other Ambulatory Visit: Payer: Self-pay

## 2019-09-30 ENCOUNTER — Ambulatory Visit (HOSPITAL_COMMUNITY)
Admission: RE | Admit: 2019-09-30 | Discharge: 2019-09-30 | Disposition: A | Discharge status: Home / Self Care | Payer: No Typology Code available for payment source | Source: Ambulatory Visit | Attending: Internal Medicine | Admitting: Internal Medicine

## 2019-09-30 ENCOUNTER — Ambulatory Visit (HOSPITAL_COMMUNITY): Payer: No Typology Code available for payment source

## 2019-09-30 DIAGNOSIS — J1282 Pneumonia due to coronavirus disease 2019: Secondary | ICD-10-CM | POA: Insufficient documentation

## 2019-09-30 DIAGNOSIS — U071 COVID-19: Secondary | ICD-10-CM | POA: Insufficient documentation

## 2019-10-01 ENCOUNTER — Encounter (HOSPITAL_COMMUNITY): Payer: No Typology Code available for payment source

## 2019-10-03 ENCOUNTER — Encounter (HOSPITAL_COMMUNITY): Payer: No Typology Code available for payment source

## 2019-10-03 ENCOUNTER — Other Ambulatory Visit: Payer: Self-pay

## 2019-10-03 ENCOUNTER — Encounter (HOSPITAL_COMMUNITY)
Admission: RE | Admit: 2019-10-03 | Discharge: 2019-10-03 | Disposition: A | Discharge status: Home / Self Care | Payer: No Typology Code available for payment source | Source: Ambulatory Visit | Attending: Internal Medicine | Admitting: Internal Medicine

## 2019-10-03 DIAGNOSIS — J1289 Other viral pneumonia: Secondary | ICD-10-CM | POA: Insufficient documentation

## 2019-10-03 DIAGNOSIS — U071 COVID-19: Secondary | ICD-10-CM | POA: Insufficient documentation

## 2019-10-03 DIAGNOSIS — J1282 Pneumonia due to coronavirus disease 2019: Secondary | ICD-10-CM

## 2019-10-03 NOTE — Progress Notes (Signed)
Discharge Progress Report  Patient Details  Name: Jamie Burton MRN: [id number] Date of Birth: [birth year] Referring Provider:     PULMONARY REHAB OTHER RESP ORIENTATION from 08/05/2019 in [facility] C[facility] RE[facility]  Referring Provider  [name]       Number of Visits: 12  Reason for Discharge:  Early Exit:  Personal  Smoking History:  Social History   Tobacco Use  Smoking Status Never Smoker  Smokeless Tobacco Never Used    Diagnosis:  Pneumonia due to COVID-19 virus  ADL UCSD: Pulmonary Assessment Scores    Row Name 08/05/19 1138         ADL UCSD   ADL Phase  Entry     SOB Score total  48     Rest  0     Walk  10     Stairs  3     Bath  3     Dress  3     Shop  3       CAT Score   CAT Score  9       mMRC Score   mMRC Score  3        Initial Exercise Prescription: Initial Exercise Prescription - 08/05/19 1100      Date of Initial Exercise RX and Referring Provider   Date  08/05/19    Referring Provider  [name]    Expected Discharge Date  10/05/18      T5 Nustep   Level  1    SPM  60    Minutes  22    METs  1.4      Track   Laps  6    Minutes  17    METs  2.74      Prescription Details   Frequency (times per week)  2    Duration  Progress to 30 minutes of continuous aerobic without signs/symptoms of physical distress      Intensity   THRR 40-80% of Max Heartrate  [phone]    Ratings of Perceived Exertion  11-13    Perceived Dyspnea  0-4      Progression   Progression  Continue to progress workloads to maintain intensity without signs/symptoms of physical distress.      Resistance Training   Training Prescription  Yes    Weight  1    Reps  10-15       Discharge Exercise Prescription (Final Exercise Prescription Changes): Exercise Prescription Changes - 08/08/19 0900      Response to Exercise   Blood Pressure (Admit)  138/84    Blood Pressure (Exercise)  140/82    Blood Pressure (Exit)  110/60    Heart Rate  (Admit)  73 bpm    Heart Rate (Exercise)  90 bpm    Heart Rate (Exit)  74 bpm    Oxygen Saturation (Admit)  98 %    Oxygen Saturation (Exercise)  94 %    Oxygen Saturation (Exit)  94 %    Rating of Perceived Exertion (Exercise)  12    Perceived Dyspnea (Exercise)  14    Symptoms  extreme SOB    Comments  morbid obesity    Duration  Continue with 30 min of aerobic exercise without signs/symptoms of physical distress.    Intensity  THRR unchanged      Progression   Progression  Continue to progress workloads to maintain intensity without signs/symptoms of physical distress.    Average METs  2.07  Resistance Training   Training Prescription  Yes    Weight  1    Reps  10-15      T5 Nustep   Level  1    SPM  52    Minutes  22    METs  1.7      Track   Laps  5    Minutes  17    METs  2.45      Home Exercise Plan   Plans to continue exercise at  Home (comment)    Frequency  Add 3 additional days to program exercise sessions.    Initial Home Exercises Provided  08/05/19       Functional Capacity: 6 Minute Walk    Row Name 08/05/19 1007         6 Minute Walk   Phase  Initial     Distance  800 feet     Walk Time  6 minutes     # of Rest Breaks  5 rest-1:21, 2:10, 10 seconds, 22 seconds, 10 seconds     MPH  1.51     METS  2.16     RPE  12     Perceived Dyspnea   16     VO2 Peak  2.84     Symptoms  Yes (comment)     Comments  Patient was very short of breath, had to stop to rest 5 times. Complained of bilateral knee pain 3/10. Knee pain gone after 2 minutes rest.     Resting HR  64 bpm     Resting BP  108/68     Resting Oxygen Saturation   92 %     Exercise Oxygen Saturation  during 6 min walk  89 %     Max Ex. HR  101 bpm     Max Ex. BP  138/110     2 Minute Post BP  128/78        Psychological, QOL, Others - Outcomes: PHQ 2/9: Depression screen PHQ 2/9 08/05/2019  Decreased Interest 0  Down, Depressed, Hopeless 1  PHQ - 2 Score 1  Altered sleeping 2   Tired, decreased energy 1  Change in appetite 1  Feeling bad or failure about yourself  0  Trouble concentrating 0  Moving slowly or fidgety/restless 0  Suicidal thoughts 0  PHQ-9 Score 5  Difficult doing work/chores Not difficult at all    Quality of Life: Quality of Life - 08/05/19 1020      Quality of Life   Select  Quality of Life      Quality of Life Scores   Health/Function Pre  20.75 %    Socioeconomic Pre  20.75 %    Psych/Spiritual Pre  21 %    Family Pre  20.8 %    GLOBAL Pre  20.81 %       Personal Goals: Goals established at orientation with interventions provided to work toward goal. Personal Goals and Risk Factors at Admission - 08/05/19 1145      Core Components/Risk Factors/Patient Goals on Admission    Weight Management  Yes    Intervention  Weight Management/Obesity: Establish reasonable short term and long term weight goals.    Admit Weight  416 lb 9.6 oz (189 kg)    Goal Weight: Short Term  409 lb 1.6 oz (185.6 kg)    Goal Weight: Long Term  401 lb 9.6 oz (182.2 kg)    Expected Outcomes  Short Term: Continue to assess and modify interventions until short term weight is achieved;Long Term: Adherence to nutrition and physical activity/exercise program aimed toward attainment of established weight goal    Improve shortness of breath with ADL's  Yes    Intervention  Provide education, individualized exercise plan and daily activity instruction to help decrease symptoms of SOB with activities of daily living.    Expected Outcomes  Short Term: Improve cardiorespiratory fitness to achieve a reduction of symptoms when performing ADLs;Long Term: Be able to perform more ADLs without symptoms or delay the onset of symptoms    Personal Goal Other  Yes    Personal Goal  To breath better and to lose 15lbs.    Intervention  Attend program 2 x week and to supplement with home exercise plan 3 x week.    Expected Outcomes  to reach short term and long term goals.         Personal Goals Discharge:   Exercise Goals and Review: Exercise Goals    Row Name 08/05/19 1020             Exercise Goals   Increase Physical Activity  Yes       Intervention  Provide advice, education, support and counseling about physical activity/exercise needs.;Develop an individualized exercise prescription for aerobic and resistive training based on initial evaluation findings, risk stratification, comorbidities and participant's personal goals.       Expected Outcomes  Short Term: Attend rehab on a regular basis to increase amount of physical activity.;Long Term: Add in home exercise to make exercise part of routine and to increase amount of physical activity.;Long Term: Exercising regularly at least 3-5 days a week.       Increase Strength and Stamina  Yes       Intervention  Provide advice, education, support and counseling about physical activity/exercise needs.;Develop an individualized exercise prescription for aerobic and resistive training based on initial evaluation findings, risk stratification, comorbidities and participant's personal goals.       Expected Outcomes  Short Term: Increase workloads from initial exercise prescription for resistance, speed, and METs.;Short Term: Perform resistance training exercises routinely during rehab and add in resistance training at home;Long Term: Improve cardiorespiratory fitness, muscular endurance and strength as measured by increased METs and functional capacity ( )       Able to understand and use rate of perceived exertion (RPE) scale  Yes       Intervention  Provide education and explanation on how to use RPE scale       Expected Outcomes  Short Term: Able to use RPE daily in rehab to express subjective intensity level;Long Term:  Able to use RPE to guide intensity level when exercising independently       Able to understand and use Dyspnea scale  Yes       Intervention  Provide education and explanation on how to use Dyspnea  scale       Expected Outcomes  Short Term: Able to use Dyspnea scale daily in rehab to express subjective sense of shortness of breath during exertion;Long Term: Able to use Dyspnea scale to guide intensity level when exercising independently       Knowledge and understanding of Target Heart Rate Range (THRR)  Yes       Intervention  Provide education and explanation of THRR including how the numbers were predicted and where they are located for reference       Expected Outcomes  Short Term:  Able to state/look up THRR;Long Term: Able to use THRR to govern intensity when exercising independently;Short Term: Able to use daily as guideline for intensity in rehab       Able to check pulse independently  Yes       Intervention  Provide education and demonstration on how to check pulse in carotid and radial arteries.;Review the importance of being able to check your own pulse for safety during independent exercise       Expected Outcomes  Short Term: Able to explain why pulse checking is important during independent exercise;Long Term: Able to check pulse independently and accurately       Understanding of Exercise Prescription  Yes       Intervention  Provide education, explanation, and written materials on patient's individual exercise prescription       Expected Outcomes  Short Term: Able to explain program exercise prescription;Long Term: Able to explain home exercise prescription to exercise independently          Exercise Goals Re-Evaluation: Exercise Goals Re-Evaluation    Row Name 08/08/19 0930             Exercise Goal Re-Evaluation   Exercise Goals Review  Increase Physical Activity;Increase Strength and Stamina       Comments  Jamie Burton's goals are to breath better and to lose 15lbs.       Expected Outcomes  to reach her goals on or before graduation          Nutrition & Weight - Outcomes: Pre Biometrics - 08/05/19 1015      Pre Biometrics   Waist Circumference  56 inches    Hip  Circumference  65 inches    Waist to Hip Ratio  0.86 %    Triceps Skinfold  80 mm    % Body Fat  74.3 %    Grip Strength  11.4 kg    Flexibility  0 in    Single Leg Stand  7.33 seconds        Nutrition: Nutrition Therapy & Goals - 08/05/19 1141      Personal Nutrition Goals   Personal Goal #2  Patient has been encourage to eat a more heart healthy diet to promote weight lose. Handout given on how to make heart healthy food choices.    Additional Goals?  No      Intervention Plan   Intervention  Nutrition handout(s) given to patient.       Nutrition Discharge: Nutrition Assessments - 08/05/19 1142      MEDFICTS Scores   Pre Score  65       Education Questionnaire Score: Knowledge Questionnaire Score - 08/05/19 1143      Knowledge Questionnaire Score   Pre Score  12/18       Patient stopped attending the program 10/03/19 due to recently being diagnosed with plantar fascitis. She completed 6 sessions. MD will be notified.

## 2019-10-03 NOTE — Progress Notes (Signed)
Incomplete Session Note  Patient Details  Name: Jamie Burton MRN: [id number] Date of Birth: 1966/02/07 Referring Provider:     PULMONARY REHAB OTHER RESP ORIENTATION from 08/05/2019 in [facility]AC REH[facility]IL[facility]  Referring Provider  [name] did not complete her rehab session.  Patient was only able to walk the track due to obesity. She has recently been diagnosed with plantar fasciitis. She was ambulating slowly limping. Staff felt that walking for 30 minutes would aggravate her condition. She was advised to be discharge from the program. She was in agreement. MD will be notified.

## 2019-10-03 NOTE — Progress Notes (Signed)
Pulmonary Individual Treatment Plan  Patient Details  Name: Jamie Burton MRN: [id number] Date of Birth: 11-Oct-1965 Referring Provider:     PULMONARY REHAB OTHER [facility]  Referring Provider  [name]      Initial Encounter Date:    PULMONARY REHAB OTHER RESP OR[facility]ATION [facility]05/[facility]  Date  08/05/19      Visit Diagnosis: Pneumonia due to COVID-19 virus  Patient's Home Medications on Admission:   Current Outpatient Medications:  .  albuterol (VENTOLIN HFA) 108 (90 Base) MCG/ACT inhaler, Inhale 2 puffs into the lungs every 6 (six) hours as needed for wheezing or shortness of breath., Disp: 6.7 g, Rfl: 0 .  amLODipine (NORVASC) 10 MG tablet, Take 1 tablet (10 mg total) by mouth daily., Disp: 30 tablet, Rfl: 0 .  apixaban (ELIQUIS) 5 MG TABS tablet, Take 1 tablet (5 mg total) by mouth 2 (two) times daily., Disp: 60 tablet, Rfl: 0 .  atenolol (TENORMIN) 50 MG tablet, Take 50 mg by mouth daily., Disp: , Rfl:  .  hydrochlorothiazide (HYDRODIURIL) 25 MG tablet, Take 25 mg by mouth daily., Disp: , Rfl:  .  lovastatin (MEVACOR) 20 MG tablet, Take 40 mg by mouth daily. , Disp: , Rfl:   Past Medical History: Past Medical History:  Diagnosis Date  . Arthritis   . Dyspnea   . Heart murmur    told she had a heart murmur as a child but unsure now  . Hyperlipidemia   . Hypertension   . Pneumonia due to COVID-19 virus 04/30/2019  . Pre-diabetes     Tobacco Use: Social History   Tobacco Use  Smoking Status Never Smoker  Smokeless Tobacco Never Used    Labs: Recent Review Flowsheet Data    Labs for ITP Cardiac and Pulmonary Rehab Latest Ref Rng & Units 05/01/2019 05/02/2019   Cholestrol 0 - 200 mg/dL - 782187   LDLCALC 0 - 99 mg/dL - 956(O127(H)   HDL >13>40 mg/dL - 08(M36(L)   Trlycerides <578<150 mg/dL - 469122   Hemoglobin G2XA1c 4.8 - 5.6 % 6.6(H) -      Capillary Blood Glucose: Lab Results   Component Value Date   GLUCAP 137 (H) 05/05/2019   GLUCAP 176 (H) 05/04/2019   GLUCAP 240 (H) 05/04/2019   GLUCAP 205 (H) 05/04/2019   GLUCAP 145 (H) 05/04/2019     Pulmonary Assessment Scores: Pulmonary Assessment Scores    Row Name 08/05/19 1138         ADL UCSD   ADL Phase  Entry     SOB Score total  48     Rest  0     Walk  10     Stairs  3     Bath  3     Dress  3     Shop  3       CAT Score   CAT Score  9       mMRC Score   mMRC Score  3       UCSD: Self-administered rating of dyspnea associated with activities of daily living (ADLs) 6-point scale (0 = "not at all" to 5 = "maximal or unable to do because of breathlessness")  Scoring Scores range from 0 to 120.  Minimally important difference is 5 units  CAT: CAT can identify the health impairment of COPD patients and is better correlated with disease progression.  CAT has a scoring range of  zero to 40. The CAT score is classified into four groups of low (less than 10), medium (10 - 20), high (21-30) and very high (31-40) based on the impact level of disease on health status. A CAT score over 10 suggests significant symptoms.  A worsening CAT score could be explained by an exacerbation, poor medication adherence, poor inhaler technique, or progression of COPD or comorbid conditions.  CAT MCID is 2 points  mMRC: mMRC (Modified Medical Research Council) Dyspnea Scale is used to assess the degree of baseline functional disability in patients of respiratory disease due to dyspnea. No minimal important difference is established. A decrease in score of 1 point or greater is considered a positive change.   Pulmonary Function Assessment:   Exercise Target Goals: Exercise Program Goal: Individual exercise prescription set using results from initial 6 min walk test and THRR while considering  patient's activity barriers and safety.   Exercise Prescription Goal: Initial exercise prescription builds to 30-45 minutes a  day of aerobic activity, 2-3 days per week.  Home exercise guidelines will be given to patient during program as part of exercise prescription that the participant will acknowledge.  Activity Barriers & Risk Stratification:   6 Minute Walk: 6 Minute Walk    Row Name 08/05/19 1007         6 Minute Walk   Phase  Initial     Distance  800 feet     Walk Time  6 minutes     # of Rest Breaks  5 rest-1:21, 2:10, 10 seconds, 22 seconds, 10 seconds     MPH  1.51     METS  2.16     RPE  12     Perceived Dyspnea   16     VO2 Peak  2.84     Symptoms  Yes (comment)     Comments  Patient was very short of breath, had to stop to rest 5 times. Complained of bilateral knee pain 3/10. Knee pain gone after 2 minutes rest.     Resting HR  64 bpm     Resting BP  108/68     Resting Oxygen Saturation   92 %     Exercise Oxygen Saturation  during 6 min walk  89 %     Max Ex. HR  101 bpm     Max Ex. BP  138/110     2 Minute Post BP  128/78        Oxygen Initial Assessment: Oxygen Initial Assessment - 08/05/19 1136      Home Oxygen   Home Oxygen Device  None    Sleep Oxygen Prescription  CPAP    Liters per minute  2    Home Exercise Oxygen Prescription  None    Home at Rest Exercise Oxygen Prescription  None    Compliance with Home Oxygen Use  Yes      Initial 6 min Walk   Oxygen Used  None      Program Oxygen Prescription   Program Oxygen Prescription  None       Oxygen Re-Evaluation:   Oxygen Discharge (Final Oxygen Re-Evaluation):   Initial Exercise Prescription: Initial Exercise Prescription - 08/05/19 1100      Date of Initial Exercise RX and Referring Provider   Date  08/05/19    Referring Provider  [name]    Expected Discharge Date  10/05/18      T5 Nustep   Level  1  SPM  60    Minutes  22    METs  1.4      Track   Laps  6    Minutes  17    METs  2.74      Prescription Details   Frequency (times per week)  2    Duration  Progress to 30 minutes of  continuous aerobic without signs/symptoms of physical distress      Intensity   THRR 40-80% of Max Heartrate  [phone]    Ratings of Perceived Exertion  11-13    Perceived Dyspnea  0-4      Progression   Progression  Continue to progress workloads to maintain intensity without signs/symptoms of physical distress.      Resistance Training   Training Prescription  Yes    Weight  1    Reps  10-15       Perform Capillary Blood Glucose checks as needed.  Exercise Prescription Changes:  Exercise Prescription Changes    Row Name 08/08/19 0900             Response to Exercise   Blood Pressure (Admit)  138/84       Blood Pressure (Exercise)  140/82       Blood Pressure (Exit)  110/60       Heart Rate (Admit)  73 bpm       Heart Rate (Exercise)  90 bpm       Heart Rate (Exit)  74 bpm       Oxygen Saturation (Admit)  98 %       Oxygen Saturation (Exercise)  94 %       Oxygen Saturation (Exit)  94 %       Rating of Perceived Exertion (Exercise)  12       Perceived Dyspnea (Exercise)  14       Symptoms  extreme SOB       Comments  morbid obesity       Duration  Continue with 30 min of aerobic exercise without signs/symptoms of physical distress.       Intensity  THRR unchanged         Progression   Progression  Continue to progress workloads to maintain intensity without signs/symptoms of physical distress.       Average METs  2.07         Resistance Training   Training Prescription  Yes       Weight  1       Reps  10-15         T5 Nustep   Level  1       SPM  52       Minutes  22       METs  1.7         Track   Laps  5       Minutes  17       METs  2.45         Home Exercise Plan   Plans to continue exercise at  Home (comment)       Frequency  Add 3 additional days to program exercise sessions.       Initial Home Exercises Provided  08/05/19          Exercise Comments:  Exercise Comments    Row Name 08/08/19 1950           Exercise Comments   Jamie Burton is only on her 2nd  visit. She is having to walk the track for her 1st exercise modality and then the Nustep. Will progress her as tolerated.          Exercise Goals and Review:  Exercise Goals    Row Name 08/05/19 1020             Exercise Goals   Increase Physical Activity  Yes       Intervention  Provide advice, education, support and counseling about physical activity/exercise needs.;Develop an individualized exercise prescription for aerobic and resistive training based on initial evaluation findings, risk stratification, comorbidities and participant's personal goals.       Expected Outcomes  Short Term: Attend rehab on a regular basis to increase amount of physical activity.;Long Term: Add in home exercise to make exercise part of routine and to increase amount of physical activity.;Long Term: Exercising regularly at least 3-5 days a week.       Increase Strength and Stamina  Yes       Intervention  Provide advice, education, support and counseling about physical activity/exercise needs.;Develop an individualized exercise prescription for aerobic and resistive training based on initial evaluation findings, risk stratification, comorbidities and participant's personal goals.       Expected Outcomes  Short Term: Increase workloads from initial exercise prescription for resistance, speed, and METs.;Short Term: Perform resistance training exercises routinely during rehab and add in resistance training at home;Long Term: Improve cardiorespiratory fitness, muscular endurance and strength as measured by increased METs and functional capacity ( )       Able to understand and use rate of perceived exertion (RPE) scale  Yes       Intervention  Provide education and explanation on how to use RPE scale       Expected Outcomes  Short Term: Able to use RPE daily in rehab to express subjective intensity level;Long Term:  Able to use RPE to guide intensity level when exercising independently        Able to understand and use Dyspnea scale  Yes       Intervention  Provide education and explanation on how to use Dyspnea scale       Expected Outcomes  Short Term: Able to use Dyspnea scale daily in rehab to express subjective sense of shortness of breath during exertion;Long Term: Able to use Dyspnea scale to guide intensity level when exercising independently       Knowledge and understanding of Target Heart Rate Range (THRR)  Yes       Intervention  Provide education and explanation of THRR including how the numbers were predicted and where they are located for reference       Expected Outcomes  Short Term: Able to state/look up THRR;Long Term: Able to use THRR to govern intensity when exercising independently;Short Term: Able to use daily as guideline for intensity in rehab       Able to check pulse independently  Yes       Intervention  Provide education and demonstration on how to check pulse in carotid and radial arteries.;Review the importance of being able to check your own pulse for safety during independent exercise       Expected Outcomes  Short Term: Able to explain why pulse checking is important during independent exercise;Long Term: Able to check pulse independently and accurately       Understanding of Exercise Prescription  Yes       Intervention  Provide education, explanation, and written materials on patient's individual  exercise prescription       Expected Outcomes  Short Term: Able to explain program exercise prescription;Long Term: Able to explain home exercise prescription to exercise independently          Exercise Goals Re-Evaluation : Exercise Goals Re-Evaluation    Row Name 08/08/19 0930             Exercise Goal Re-Evaluation   Exercise Goals Review  Increase Physical Activity;Increase Strength and Stamina       Comments  Jamie Burton's goals are to breath better and to lose 15lbs.       Expected Outcomes  to reach her goals on or before graduation           Discharge Exercise Prescription (Final Exercise Prescription Changes): Exercise Prescription Changes - 08/08/19 0900      Response to Exercise   Blood Pressure (Admit)  138/84    Blood Pressure (Exercise)  140/82    Blood Pressure (Exit)  110/60    Heart Rate (Admit)  73 bpm    Heart Rate (Exercise)  90 bpm    Heart Rate (Exit)  74 bpm    Oxygen Saturation (Admit)  98 %    Oxygen Saturation (Exercise)  94 %    Oxygen Saturation (Exit)  94 %    Rating of Perceived Exertion (Exercise)  12    Perceived Dyspnea (Exercise)  14    Symptoms  extreme SOB    Comments  morbid obesity    Duration  Continue with 30 min of aerobic exercise without signs/symptoms of physical distress.    Intensity  THRR unchanged      Progression   Progression  Continue to progress workloads to maintain intensity without signs/symptoms of physical distress.    Average METs  2.07      Resistance Training   Training Prescription  Yes    Weight  1    Reps  10-15      T5 Nustep   Level  1    SPM  52    Minutes  22    METs  1.7      Track   Laps  5    Minutes  17    METs  2.45      Home Exercise Plan   Plans to continue exercise at  Home (comment)    Frequency  Add 3 additional days to program exercise sessions.    Initial Home Exercises Provided  08/05/19       Nutrition:  Target Goals: Understanding of nutrition guidelines, daily intake of sodium 1500mg , cholesterol 200mg , calories 30% from fat and 7% or less from saturated fats, daily to have 5 or more servings of fruits and vegetables.  Biometrics: Pre Biometrics - 08/05/19 1015      Pre Biometrics   Waist Circumference  56 inches    Hip Circumference  65 inches    Waist to Hip Ratio  0.86 %    Triceps Skinfold  80 mm    % Body Fat  74.3 %    Grip Strength  11.4 kg    Flexibility  0 in    Single Leg Stand  7.33 seconds        Nutrition Therapy Plan and Nutrition Goals: Nutrition Therapy & Goals - 08/05/19 1141       Personal Nutrition Goals   Personal Goal #2  Patient has been encourage to eat a more heart healthy diet to promote weight lose. Handout given  on how to make heart healthy food choices.    Additional Goals?  No      Intervention Plan   Intervention  Nutrition handout(s) given to patient.       Nutrition Assessments: Nutrition Assessments - 08/05/19 1142      MEDFICTS Scores   Pre Score  65       Nutrition Goals Re-Evaluation:   Nutrition Goals Discharge (Final Nutrition Goals Re-Evaluation):   Psychosocial: Target Goals: Acknowledge presence or absence of significant depression and/or stress, maximize coping skills, provide positive support system. Participant is able to verbalize types and ability to use techniques and skills needed for reducing stress and depression.  Initial Review & Psychosocial Screening: Initial Psych Review & Screening - 08/05/19 1139      Initial Review   Current issues with  None Identified;Current Depression   A little down because she is not able to work right now.     Family Dynamics   Good Support System?  Yes      Barriers   Psychosocial barriers to participate in program  There are no identifiable barriers or psychosocial needs.      Screening Interventions   Interventions  Encouraged to exercise    Expected Outcomes  Short Term goal: Identification and review with participant of any Quality of Life or Depression concerns found by scoring the questionnaire.;Long Term goal: The participant improves quality of Life and PHQ9 Scores as seen by post scores and/or verbalization of changes       Quality of Life Scores: Quality of Life - 08/05/19 1020      Quality of Life   Select  Quality of Life      Quality of Life Scores   Health/Function Pre  20.75 %    Socioeconomic Pre  20.75 %    Psych/Spiritual Pre  21 %    Family Pre  20.8 %    GLOBAL Pre  20.81 %      Scores of 19 and below usually indicate a poorer quality of life in these  areas.  A difference of  2-3 points is a clinically meaningful difference.  A difference of 2-3 points in the total score of the Quality of Life Index has been associated with significant improvement in overall quality of life, self-image, physical symptoms, and general health in studies assessing change in quality of life.   PHQ-9: Recent Review Flowsheet Data    Depression screen [facility] 2/9 08/05/2019   Decreased Interest 0   Down, Depressed, Hopeless 1   PHQ - 2 Score 1   Altered sleeping 2   Tired, decreased energy 1   Change in appetite 1   Feeling bad or failure about yourself  0   Trouble concentrating 0   Moving slowly or fidgety/restless 0   Suicidal thoughts 0   PHQ-9 Score 5   Difficult doing work/chores Not difficult at all     Interpretation of Total Score  Total Score Depression Severity:  1-4 = Minimal depression, 5-9 = Mild depression, 10-14 = Moderate depression, 15-19 = Moderately severe depression, 20-27 = Severe depression   Psychosocial Evaluation and Intervention: Psychosocial Evaluation - 08/05/19 1140      Psychosocial Evaluation & Interventions   Interventions  Encouraged to exercise with the program and follow exercise prescription    Continue Psychosocial Services   No Follow up required       Psychosocial Re-Evaluation:   Psychosocial Discharge (Final Psychosocial Re-Evaluation):    Education:  Education Goals: Education classes will be provided on a weekly basis, covering required topics. Participant will state understanding/return demonstration of topics presented.  Learning Barriers/Preferences: Learning Barriers/Preferences - 08/05/19 1024      Learning Barriers/Preferences   Learning Barriers  None    Learning Preferences  Pictoral;Video;Written Material;Computer/Internet       Education Topics: How Lungs Work and Diseases: - Discuss the anatomy of the lungs and diseases that can affect the lungs, such as COPD.   Exercise: -Discuss  the importance of exercise, FITT principles of exercise, normal and abnormal responses to exercise, and how to exercise safely.   Environmental Irritants: -Discuss types of environmental irritants and how to limit exposure to environmental irritants.   Meds/Inhalers and oxygen: - Discuss respiratory medications, definition of an inhaler and oxygen, and the proper way to use an inhaler and oxygen.   Energy Saving Techniques: - Discuss methods to conserve energy and decrease shortness of breath when performing activities of daily living.    Bronchial Hygiene / Breathing Techniques: - Discuss breathing mechanics, pursed-lip breathing technique,  proper posture, effective ways to clear airways, and other functional breathing techniques   Cleaning Equipment: - Provides group verbal and written instruction about the health risks of elevated stress, cause of high stress, and healthy ways to reduce stress.   Nutrition I: Fats: - Discuss the types of cholesterol, what cholesterol does to the body, and how cholesterol levels can be controlled.   Nutrition II: Labels: -Discuss the different components of food labels and how to read food labels.   Respiratory Infections: - Discuss the signs and symptoms of respiratory infections, ways to prevent respiratory infections, and the importance of seeking medical treatment when having a respiratory infection.   Stress I: Signs and Symptoms: - Discuss the causes of stress, how stress may lead to anxiety and depression, and ways to limit stress.   CARDIAC REHAB PHASE II EXERCISE from 08/15/2019 in [place]  Date  08/08/19  Educator  DC  Instruction Review Code  2- Demonstrated Understanding      Stress II: Relaxation: -Discuss relaxation techniques to limit stress.   CARDIAC REHAB PHASE II EXERCISE from 08/15/2019 in [place]  Date  08/15/19  Educator  D. Coad  Instruction Review Code  2-  Demonstrated Understanding      Oxygen for Home/Travel: - Discuss how to prepare for travel when on oxygen and proper ways to transport and store oxygen to ensure safety.   Knowledge Questionnaire Score: Knowledge Questionnaire Score - 08/05/19 1143      Knowledge Questionnaire Score   Pre Score  12/18       Core Components/Risk Factors/Patient Goals at Admission: Personal Goals and Risk Factors at Admission - 08/05/19 1145      Core Components/Risk Factors/Patient Goals on Admission    Weight Management  Yes    Intervention  Weight Management/Obesity: Establish reasonable short term and long term weight goals.    Admit Weight  416 lb 9.6 oz (189 kg)    Goal Weight: Short Term  409 lb 1.6 oz (185.6 kg)    Goal Weight: Long Term  401 lb 9.6 oz (182.2 kg)    Expected Outcomes  Short Term: Continue to assess and modify interventions until short term weight is achieved;Long Term: Adherence to nutrition and physical activity/exercise program aimed toward attainment of established weight goal    Improve shortness of breath with ADL's  Yes    Intervention  Provide education, individualized exercise plan and daily activity instruction to help decrease symptoms of SOB with activities of daily living.    Expected Outcomes  Short Term: Improve cardiorespiratory fitness to achieve a reduction of symptoms when performing ADLs;Long Term: Be able to perform more ADLs without symptoms or delay the onset of symptoms    Personal Goal Other  Yes    Personal Goal  To breath better and to lose 15lbs.    Intervention  Attend program 2 x week and to supplement with home exercise plan 3 x week.    Expected Outcomes  to reach short term and long term goals.       Core Components/Risk Factors/Patient Goals Review:    Core Components/Risk Factors/Patient Goals at Discharge (Final Review):    ITP Comments: ITP Comments    Row Name 09/06/19 1350 10/03/19 1450         ITP Comments  Pulmonary rehab  has been suspended due to department closing and reassignment of staff secondary to COVID surge. Patient wants to return and complete the program when we reopen. She has completed 6 sessions. She is not interested in the [facility] virtual program. She says she plans to continue walking all she can until we reopen. Will f/u when we reopen.  Patient stopped attending the program 10/03/19 due to recently being diagnosed with plantar fascitis. She completed 6 sessions. MD will be notified.         Comments: Patient stopped coming to Pulmonary Rehab on 10/03/19 due to recently being diagnosed with plantar fascitis. She completed 6 sessions.  MD will be notified.

## 2019-10-08 ENCOUNTER — Encounter (HOSPITAL_COMMUNITY): Payer: No Typology Code available for payment source

## 2019-10-10 ENCOUNTER — Encounter (HOSPITAL_COMMUNITY): Payer: No Typology Code available for payment source

## 2019-10-11 ENCOUNTER — Other Ambulatory Visit (HOSPITAL_COMMUNITY): Payer: No Typology Code available for payment source

## 2019-10-15 ENCOUNTER — Encounter (HOSPITAL_COMMUNITY): Payer: No Typology Code available for payment source

## 2019-10-17 ENCOUNTER — Encounter (HOSPITAL_COMMUNITY): Payer: No Typology Code available for payment source

## 2019-10-22 ENCOUNTER — Encounter (HOSPITAL_COMMUNITY): Payer: No Typology Code available for payment source

## 2019-10-24 ENCOUNTER — Encounter (HOSPITAL_COMMUNITY): Payer: No Typology Code available for payment source

## 2019-10-25 NOTE — Progress Notes (Signed)
No ild or residue of covid in lungs - on ct chest

## 2019-10-29 ENCOUNTER — Encounter (HOSPITAL_COMMUNITY): Payer: No Typology Code available for payment source

## 2019-10-31 ENCOUNTER — Encounter (HOSPITAL_COMMUNITY): Payer: No Typology Code available for payment source

## 2019-11-05 ENCOUNTER — Encounter (HOSPITAL_COMMUNITY): Payer: No Typology Code available for payment source

## 2019-11-07 ENCOUNTER — Encounter (HOSPITAL_COMMUNITY): Payer: No Typology Code available for payment source

## 2019-11-12 ENCOUNTER — Encounter (HOSPITAL_COMMUNITY): Payer: No Typology Code available for payment source

## 2019-11-14 ENCOUNTER — Encounter (HOSPITAL_COMMUNITY): Payer: No Typology Code available for payment source

## 2019-11-19 ENCOUNTER — Encounter (HOSPITAL_COMMUNITY): Payer: No Typology Code available for payment source

## 2019-11-21 ENCOUNTER — Encounter (HOSPITAL_COMMUNITY): Payer: No Typology Code available for payment source

## 2019-11-22 ENCOUNTER — Other Ambulatory Visit: Payer: Self-pay

## 2019-11-22 ENCOUNTER — Other Ambulatory Visit (HOSPITAL_COMMUNITY)
Admission: RE | Admit: 2019-11-22 | Discharge: 2019-11-22 | Disposition: A | Discharge status: Home / Self Care | Payer: No Typology Code available for payment source | Source: Ambulatory Visit | Attending: Internal Medicine | Admitting: Internal Medicine

## 2019-11-22 DIAGNOSIS — Z20822 Contact with and (suspected) exposure to covid-19: Secondary | ICD-10-CM | POA: Insufficient documentation

## 2019-11-22 DIAGNOSIS — Z01812 Encounter for preprocedural laboratory examination: Secondary | ICD-10-CM | POA: Diagnosis not present

## 2019-11-23 LAB — SARS CORONAVIRUS 2 (TAT 6-24 HRS): SARS Coronavirus 2: NEGATIVE

## 2019-11-25 ENCOUNTER — Other Ambulatory Visit: Payer: Self-pay

## 2019-11-25 ENCOUNTER — Ambulatory Visit (INDEPENDENT_AMBULATORY_CARE_PROVIDER_SITE_OTHER): Payer: No Typology Code available for payment source | Admitting: Internal Medicine

## 2019-11-25 ENCOUNTER — Encounter: Payer: Self-pay | Admitting: *Deleted

## 2019-11-25 DIAGNOSIS — U071 COVID-19: Secondary | ICD-10-CM | POA: Diagnosis not present

## 2019-11-25 DIAGNOSIS — J1282 Pneumonia due to coronavirus disease 2019: Secondary | ICD-10-CM | POA: Diagnosis not present

## 2019-11-25 LAB — PULMONARY FUNCTION TEST
DL/VA % pred: 122 %
DL/VA: 5.16 ml/min/mmHg/L
DLCO cor % pred: 72 %
DLCO cor: 16.21 ml/min/mmHg
DLCO unc % pred: 72 %
DLCO unc: 16.21 ml/min/mmHg
FEF 25-75 Post: 2.68 L/sec
FEF 25-75 Pre: 4 L/sec
FEF2575-%Change-Post: -33 %
FEF2575-%Pred-Post: 106 %
FEF2575-%Pred-Pre: 158 %
FEV1-%Change-Post: -6 %
FEV1-%Pred-Post: 81 %
FEV1-%Pred-Pre: 86 %
FEV1-Post: 2 L
FEV1-Pre: 2.14 L
FEV1FVC-%Change-Post: -2 %
FEV1FVC-%Pred-Pre: 113 %
FEV6-%Change-Post: -4 %
FEV6-%Pred-Post: 74 %
FEV6-%Pred-Pre: 77 %
FEV6-Post: 2.22 L
FEV6-Pre: 2.32 L
FEV6FVC-%Pred-Post: 102 %
FEV6FVC-%Pred-Pre: 102 %
FVC-%Change-Post: -4 %
FVC-%Pred-Post: 72 %
FVC-%Pred-Pre: 75 %
FVC-Post: 2.22 L
FVC-Pre: 2.32 L
Post FEV1/FVC ratio: 90 %
Post FEV6/FVC ratio: 100 %
Pre FEV1/FVC ratio: 92 %
Pre FEV6/FVC Ratio: 100 %
RV % pred: 85 %
RV: 1.64 L
TLC % pred: 74 %
TLC: 4 L

## 2019-11-25 NOTE — Progress Notes (Signed)
PFT done today. 

## 2019-11-26 ENCOUNTER — Encounter (HOSPITAL_COMMUNITY): Payer: No Typology Code available for payment source

## 2019-11-28 ENCOUNTER — Encounter (HOSPITAL_COMMUNITY): Payer: No Typology Code available for payment source

## 2019-12-03 ENCOUNTER — Encounter (HOSPITAL_COMMUNITY): Payer: No Typology Code available for payment source

## 2019-12-05 ENCOUNTER — Encounter (HOSPITAL_COMMUNITY): Payer: No Typology Code available for payment source

## 2019-12-10 ENCOUNTER — Telehealth: Payer: Self-pay | Admitting: Internal Medicine

## 2019-12-10 NOTE — Telephone Encounter (Signed)
When calling to go over pt's COVID screening for her appt w/ MR tomorrow, pt asked that I let you know she is now taking Lasix, 40 mg 1 per day & stopped taking hydrochlorothiazide.  Pt would like her med list update, please.

## 2019-12-10 NOTE — Telephone Encounter (Signed)
I have added lasix 40mg  and d/c'd hctz off of pt's med list. Called and spoke with pt letting her know this was done and she verbalized understanding. Nothing further needed.

## 2019-12-11 ENCOUNTER — Telehealth: Payer: Self-pay | Admitting: Internal Medicine

## 2019-12-11 ENCOUNTER — Other Ambulatory Visit: Payer: Self-pay

## 2019-12-11 ENCOUNTER — Ambulatory Visit (INDEPENDENT_AMBULATORY_CARE_PROVIDER_SITE_OTHER): Payer: No Typology Code available for payment source | Admitting: Internal Medicine

## 2019-12-11 ENCOUNTER — Encounter: Payer: Self-pay | Admitting: Internal Medicine

## 2019-12-11 ENCOUNTER — Encounter: Payer: Self-pay | Admitting: *Deleted

## 2019-12-11 VITALS — BP 132/70 | HR 66 | Ht 65.0 in | Wt 397.4 lb

## 2019-12-11 DIAGNOSIS — R0609 Other forms of dyspnea: Secondary | ICD-10-CM

## 2019-12-11 DIAGNOSIS — J1282 Pneumonia due to coronavirus disease 2019: Secondary | ICD-10-CM

## 2019-12-11 DIAGNOSIS — R7989 Other specified abnormal findings of blood chemistry: Secondary | ICD-10-CM | POA: Diagnosis not present

## 2019-12-11 DIAGNOSIS — Z9989 Dependence on other enabling machines and devices: Secondary | ICD-10-CM

## 2019-12-11 DIAGNOSIS — R0600 Dyspnea, unspecified: Secondary | ICD-10-CM

## 2019-12-11 DIAGNOSIS — G4733 Obstructive sleep apnea (adult) (pediatric): Secondary | ICD-10-CM

## 2019-12-11 DIAGNOSIS — U071 COVID-19: Secondary | ICD-10-CM

## 2019-12-11 DIAGNOSIS — R5381 Other malaise: Secondary | ICD-10-CM

## 2019-12-11 LAB — D-DIMER, QUANTITATIVE: D-Dimer, Quant: 0.5 mcg/mL FEU — ABNORMAL HIGH (ref <0.50)

## 2019-12-11 MED ORDER — APIXABAN 2.5 MG PO TABS: 2.5000 mg | ORAL_TABLET | Freq: Two times a day (BID) | ORAL | 5 refills | Status: DC

## 2019-12-11 NOTE — Telephone Encounter (Signed)
D-dimer now 0.5 and upper limit of normal  Plan  - reduce eliquis to 2.5mg  twice daily(50% of current dose)  - rov in 6 months - will do- d-dimer at that time - monitor for bleeding

## 2019-12-11 NOTE — Patient Instructions (Addendum)
Pneumonia due to COVID-19 virus Physical Deconditioning causing shortness of breath - iimproved/resolved findings in lung  - at this point general health of weight, plantar fascitis and sleep apnea are dominant issue - too bad plantar fascitis is preventing rehab  plan - focus on weight loss  - continue cPAP  Elevated d-dimer and presumed PE at hospital (CT chest was not possible at hospital) - directly related to COVID-19 - improved late sept 2020  - check d-dimer 12/11/2019 - if d-dimer slightly high - might just do low dose eliqiuis for another 6 months  - if d-dimer normal - might just do baby aspiriin for 6 months   Now confirmed sleep apnea  -glad you are on CPAP and night o2 since nov 2020  - continue care with Dr Val Eagle   Followup  6-9 months for completion visit

## 2019-12-11 NOTE — Progress Notes (Signed)
OV 05/27/2019  Subjective:  Patient ID: Jamie Burton, female , DOB: Jul 16, 1966 , age 54 y.o. , MRN: [id number] , ADDRESS: [address] [place] [address]   05/27/2019 -   Chief Complaint  Patient presents with  . Consult    hospital follow-up.  pt treated for covid, pna.  pt has good and bad days, still experiencing sob with exertion.      HPI Jamie Burton 54 y.o. -presents to the pulmonary clinic for new evaluation.  There is a post COVID 19 survivor.  Review of the chart indicates that she was admitted early September 2020.  At this point in time she is over 21 days since her first symptom.  She was admitted for 5 or 6 days at [facility].  I discussed with the hospitalist Dr. Thedore Mins.  He clearly remembers the case.  He says she was extremely hypoxemic on arrival and had an extremely high d-dimer.  She was treated with Decadron and REMdesivir.  He said that because the D-dimers was extremely high pulmonary embolism was suspected but because of either renal function or CT scan unavailability in the duplex legs being of poor quality a pulmonary embolism was presumed and she was started on IV heparin full dose anticoagulation.  The next day she improved remarkably with her hypoxemia making them for the suspect pulmonary embolism.  She is then been discharged on 6 months to 3 months of Eliquis.  Patient is here for follow-up.  At this point in time she tells me that she is much better but she still has exertional dyspnea.  She also has severe exertional fatigue no cough.  Walking desaturation test 185 feet x 3 laps on room air: She was only able to complete 1 lap and even for this she had to stop 6 times.  She did not desaturate.  She says sometimes at home she desaturates.  She has extreme tiredness.  Since her last visit she saw her primary care physician.  I reviewed those notes.  CBC and chemistries are normal.  Her most recent chest x-ray at the time of discharge showed  some pulmonary infiltrates.   Her echo from the hospital is also reviewed.  She is here with the case manager and they had multiple questions.  All of which was answered.     ROS - per HPI  Results for [name], Jamie Burton (MRN [id number]) as of 05/27/2019 11:01  Ref. Range 04/30/2019 23:30 05/01/2019 08:55 05/02/2019 06:33 05/03/2019 03:02 05/04/2019 04:36 05/05/2019 04:41 9/28  D-Dimer, Quant Latest Ref Range: 0.00 - 0.50 ug/mL-FEU >20.00 (H) >20.00 (H) >20.00 (H) 19.29 (H) 12.70 (H) 9.90 (H) 1.11     ECHO 05/01/2019 1. The left ventricle has hyperdynamic systolic function, with an ejection fraction of >65%. The cavity size was normal. There is moderately increased left ventricular wall thickness. No evidence of left ventricular regional wall motion abnormalities.  2. The right ventricle has normal systolc function. The cavity was normal. There is no increase in right ventricular wall thickness. Right ventricular systolic pressure is normal with an estimated pressure of 36.0 mmHg.  3. The aortic valve was not well visualized.  4. Pulmonic valve regurgitation was not assessed by color flow Doppler.  FINDINGS  Left Ventricle: The left ventricle has hyperdynamic systolic function, with an ejection fraction of >65%. The cavity size was normal. There is moderately increased left ventricular wall thickness. No evidence of left ventricular regional wall motion  abnormalities.  Right Ventricle: The right ventricle has normal systolic function. The cavity was normal. There is no increase in right ventricular wall thickness. Right ventricular systolic pressure is normal with an estimated pressure of 36.0 mmHg.   has a past medical history of Arthritis, Dyspnea, Heart murmur, Hyperlipidemia, Hypertension, Pneumonia due to COVID-19 virus (04/30/2019), and Pre-diabetes.   reports that she has never smoked. She has never used smokeless tobacco.  No past surgical history on file.  No Known Allergies   There  is no immunization history on file for this patient.  OV 08/26/2019  Subjective:  Patient ID: Jamie Burton, female , DOB: 1965-09-13 , age 28 y.o. , MRN: [id number] , ADDRESS: [address] [place] [address]   08/26/2019 -   Chief Complaint  Patient presents with  . Follow-up    Pt states she has been doing well since last visit and denies any complaints.   Follow-up post Covid  HPI [name] 54 y.o. -presents with her case Web designer.  She tells me that since her last visit in September 2020 her shortness of breath is improved but still not back to baseline.  Back in September 2020 when I walked 185 feet x 3 laps.  She stopped 6 times even in the first lap although she did not desaturate.  Since then she has been attending approximately 6 glasses of rehabilitation.  Today she walked 185 feet x 3 laps and she stopped only 1 time of the first lap..  She almost walked 150-170 feet without stopping.  This is an improvement.  Nevertheless she is not back to baseline.  She continues on Eliquis for her presumed pulmonary embolism.  Her D-dimer in September 2020 was significantly improved but still elevated at 1.1.  She has not been diagnosed with sleep apnea and she is on CPAP under Dr. Val Eagle with nighttime oxygen.  This is been going on since September 2020.  She feels she can go to work but she still wants to attend pulmonary rehabilitation and she still not back to baseline.  The case manager is saying okay to go to work but wants me to specify restrictions.  She is interested in the high-resolution CT chest after our discussion based on the fact that she is still short of breath.  She had questions about the COVID-19 vaccine.  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   Of note she is on albuterol inhaler.  She saw the pharmacist.  Who thought that maybe she needs to be on a schedule inhaler but she does not have any formal diagnosis of  asthma.  She says the inhaler does not necessarily help her.  She gets short of breath with exertion and then relieved by rest.  We discussed and we agreed we would just leave her on albuterol inhaler and after pulmonary function test to reassess if she needs to be on any inhaler at all. ROS - per HPI  OV 12/11/2019  Subjective:  Patient ID: Jamie Burton, female , DOB: 03-07-1966 , age 46 y.o. , MRN: [id number] , ADDRESS: [address] [place] [address]   12/11/2019 -   Chief Complaint  Patient presents with  . Follow-up    Pt states she has been doing good since last visit.     HPI Kobie Matkins 54 y.o. -returns with her case Production designer, theatre/television/film.  Overall she is doing well.  She tried to do pulmonary rehab but plantar  fasciitis intervened and she cannot do pulmonary rehab anymore.  She continues on Eliquis.  Her D-dimer has been improving but is not normal yet.  There is no shortness of breath.  She is back at work.  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  She is doing her CPAP for sleep apnea.  Overall it seems at this point in time beyond her obesity and plantar fasciitis and sleep apnea she is at baseline.  In fact she feels she is at her pre-Covid baseline.  She had a CT scan of the chest that did not show any evidence of residual fibrosis.   Results for KHARI, LETT (MRN [id number]) as of 12/11/2019 12:26  Ref. Range 04/30/2019 23:30 05/01/2019 08:55 05/02/2019 06:33 05/03/2019 03:02 05/04/2019 04:36 05/05/2019 04:41 05/27/2019 11:53  D-Dimer, Sharene Butters Latest Ref Range: <0.50 mcg/mL FEU >20.00 (H) >20.00 (H) >20.00 (H) 19.29 (H) 12.70 (H) 9.90 (H) 1.11 (H)   IMPRESSION: 1. No compelling findings of interstitial lung disease on this limited motion degraded scan. 2. Mild cardiomegaly. Borderline prominent main pulmonary artery, cannot exclude pulmonary arterial hypertension. 3. Small hiatal hernia. 4. Right adrenal adenoma.   Electronically Signed   By: Delbert Phenix M.D.   On:  09/30/2019 16:37  ROS - per HPI     has a past medical history of Arthritis, Dyspnea, Heart murmur, Hyperlipidemia, Hypertension, Pneumonia due to COVID-19 virus (04/30/2019), and Pre-diabetes.   reports that she has never smoked. She has never used smokeless tobacco.  No past surgical history on file.  No Known Allergies  Immunization History  Administered Date(s) Administered  . Influenza,inj,Quad PF,6+ Mos 05/27/2019  . Moderna SARS-COVID-2 Vaccination 10/22/2019, 12/03/2019    Family History  Problem Relation Age of Onset  . Hypertension Other      Current Outpatient Medications:  .  albuterol (VENTOLIN HFA) 108 (90 Base) MCG/ACT inhaler, Inhale 2 puffs into the lungs every 6 (six) hours as needed for wheezing or shortness of breath., Disp: 6.7 g, Rfl: 0 .  amLODipine (NORVASC) 10 MG tablet, Take 1 tablet (10 mg total) by mouth daily., Disp: 30 tablet, Rfl: 0 .  apixaban (ELIQUIS) 5 MG TABS tablet, Take 1 tablet (5 mg total) by mouth 2 (two) times daily., Disp: 60 tablet, Rfl: 0 .  atenolol (TENORMIN) 50 MG tablet, Take 50 mg by mouth daily., Disp: , Rfl:  .  furosemide (LASIX) 40 MG tablet, Take 40 mg by mouth daily., Disp: , Rfl:  .  lovastatin (MEVACOR) 20 MG tablet, Take 40 mg by mouth daily. , Disp: , Rfl:       Objective:   Vitals:   12/11/19 1209  BP: 132/70  Pulse: 66  SpO2: 98%  Weight: (!) 397 lb 6.4 oz (180.3 kg)  Height: 5\' 5"  (1.651 m)    Estimated body mass index is 66.13 kg/m as calculated from the following:   Height as of this encounter: 5\' 5"  (1.651 m).   Weight as of this encounter: 397 lb 6.4 oz (180.3 kg).  @WEIGHTCHANGE @    12/11/19 1209  Weight: (!) 397 lb 6.4 oz (180.3 kg)     Physical Exam Morbidly obese female with some antalgic gait.  Clear to auscultation bilaterally although distant breath sounds because of obesity normal heart sounds.  Abdomen is obese.  No sinus no clubbing edema.                      Assessment:  ICD-10-CM   1. Elevated d-dimer  R79.89 D-Dimer, Quantitative  2. OSA on CPAP  G47.33    Z99.89   3. Pneumonia due to COVID-19 virus  U07.1    J12.82   4. Dyspnea on exertion  R06.00   5. Physical deconditioning  R53.81        Plan:     Patient Instructions  Pneumonia due to COVID-19 virus Physical Deconditioning causing shortness of breath - iimproved/resolved findings in lung  - at this point general health of weight, plantar fascitis and sleep apnea are dominant issue - too bad plantar fascitis is preventing rehab  plan - focus on weight loss  - continue cPAP  Elevated d-dimer and presumed PE at hospital (CT chest was not possible at hospital) - directly related to COVID-19 - improved late sept 2020  - check d-dimer 12/11/2019 - if d-dimer slightly high - might just do low dose eliqiuis for another 6 months  - if d-dimer normal - might just do baby aspiriin for 6 months   Now confirmed sleep apnea  -glad you are on CPAP and night o2 since nov 2020  - continue care with Dr Jenetta Downer   Followup  6-9 months for completion visit     SIGNATURE    Dr. Brand Males, M.D., F.C.C.P,  Pulmonary and Critical Care Medicine Staff Physician, [place] Director - Interstitial Lung Disease  Program  Pulmonary [place] [facility] [place], [place], [address]  Pager: [phone], If no answer or between  15:00h - 7:00h: call [phone] Telephone: [phone]  12:42 PM 12/11/2019

## 2019-12-11 NOTE — Telephone Encounter (Signed)
Called and spoke with pt letting her know the results of labwork and that we were going to decrease the dose of eliquis for her to stay on it for another 6 months and then to have a follow up OV in 6 months. Pt verbalized understanding. Sent rx to pharmacy for pt. Nothing further needed.

## 2020-01-29 ENCOUNTER — Telehealth: Payer: Self-pay | Admitting: Internal Medicine

## 2020-01-29 NOTE — Telephone Encounter (Signed)
Spoke with [place] at [facility].  Made him aware that the patient's insurance does not require a PA for Eliquis, it is covered under her plan.

## 2020-02-12 ENCOUNTER — Telehealth: Payer: Self-pay | Admitting: Internal Medicine

## 2020-02-12 NOTE — Telephone Encounter (Signed)
Spoke with Steward Drone. She was calling to see if the d-dimer that was done back in April 2021 determined if the patient needed to be off of the Eliquis. I advised her that based on the documentation from 12/11/19, MR suggested that the Eliquis be reduced to 2.5mg  twice daily until her F/U in 6 months (October). She wanted to go ahead and make an appt for the patient. I explained to her that MR's schedule is not yet available but I will place a recall for her. She verbalized understanding.   Nothing further needed at time of call.

## 2020-03-24 ENCOUNTER — Ambulatory Visit: Payer: No Typology Code available for payment source | Admitting: Internal Medicine

## 2020-06-18 ENCOUNTER — Other Ambulatory Visit: Payer: Self-pay | Admitting: Internal Medicine

## 2020-06-25 ENCOUNTER — Ambulatory Visit (INDEPENDENT_AMBULATORY_CARE_PROVIDER_SITE_OTHER): Payer: No Typology Code available for payment source | Admitting: Internal Medicine

## 2020-06-25 ENCOUNTER — Encounter: Payer: Self-pay | Admitting: Internal Medicine

## 2020-06-25 ENCOUNTER — Other Ambulatory Visit: Payer: Self-pay

## 2020-06-25 VITALS — BP 134/76 | HR 75 | Temp 97.9°F | Ht 65.0 in | Wt >= 6400 oz

## 2020-06-25 DIAGNOSIS — R7989 Other specified abnormal findings of blood chemistry: Secondary | ICD-10-CM | POA: Diagnosis not present

## 2020-06-25 LAB — D-DIMER, QUANTITATIVE: D-Dimer, Quant: 0.52 mcg/mL FEU — ABNORMAL HIGH (ref <0.50)

## 2020-06-25 NOTE — Patient Instructions (Addendum)
Pneumonia due to COVID-19 virus Physical Deconditioning causing shortness of breath - iimproved/resolved findings in lung  - at this point general health of weight, plantar fascitis and sleep apnea are dominant issue - too bad plantar fascitis is preventing rehab  plan - focus on weight loss  - continue cPAP  Elevated d-dimer and presumed PE at hospital (CT chest was not possible at hospital) - directly related to COVID-19 - improved late sept 2020  - near normal d-dimer April 2021 and reduced to low dose eliquis  Plan - check d-dimer  06/25/2020  - if d-dimer slightly high - might just do low dose eliqiuis for another 6 months  - if d-dimer normal - might just do baby aspiriin for 6 months   Now confirmed sleep apnea  -glad you are on CPAP and night o2 since nov 2020  - continue care with Dr Val Eagle   Followup Based on d-dimer

## 2020-06-25 NOTE — Progress Notes (Signed)
OV 05/27/2019  Subjective:  Patient ID: Jamie Burton, female , DOB: Jul 16, 1966 , age 54 y.o. , MRN: [id number] , ADDRESS: [address] [place] [address]   05/27/2019 -   Chief Complaint  Patient presents with  . Consult    hospital follow-up.  pt treated for covid, pna.  pt has good and bad days, still experiencing sob with exertion.      HPI Jamie Burton 54 y.o. -presents to the pulmonary clinic for new evaluation.  There is a post COVID 19 survivor.  Review of the chart indicates that she was admitted early September 2020.  At this point in time she is over 21 days since her first symptom.  She was admitted for 5 or 6 days at [facility].  I discussed with the hospitalist Dr. Thedore Mins.  He clearly remembers the case.  He says she was extremely hypoxemic on arrival and had an extremely high d-dimer.  She was treated with Decadron and REMdesivir.  He said that because the D-dimers was extremely high pulmonary embolism was suspected but because of either renal function or CT scan unavailability in the duplex legs being of poor quality a pulmonary embolism was presumed and she was started on IV heparin full dose anticoagulation.  The next day she improved remarkably with her hypoxemia making them for the suspect pulmonary embolism.  She is then been discharged on 6 months to 3 months of Eliquis.  Patient is here for follow-up.  At this point in time she tells me that she is much better but she still has exertional dyspnea.  She also has severe exertional fatigue no cough.  Walking desaturation test 185 feet x 3 laps on room air: She was only able to complete 1 lap and even for this she had to stop 6 times.  She did not desaturate.  She says sometimes at home she desaturates.  She has extreme tiredness.  Since her last visit she saw her primary care physician.  I reviewed those notes.  CBC and chemistries are normal.  Her most recent chest x-ray at the time of discharge showed  some pulmonary infiltrates.   Her echo from the hospital is also reviewed.  She is here with the case manager and they had multiple questions.  All of which was answered.     ROS - per HPI  Results for Jamie Burton (MRN [id number]) as of 05/27/2019 11:01  Ref. Range 04/30/2019 23:30 05/01/2019 08:55 05/02/2019 06:33 05/03/2019 03:02 05/04/2019 04:36 05/05/2019 04:41 9/28  D-Dimer, Quant Latest Ref Range: 0.00 - 0.50 ug/mL-FEU >20.00 (H) >20.00 (H) >20.00 (H) 19.29 (H) 12.70 (H) 9.90 (H) 1.11     ECHO 05/01/2019 1. The left ventricle has hyperdynamic systolic function, with an ejection fraction of >65%. The cavity size was normal. There is moderately increased left ventricular wall thickness. No evidence of left ventricular regional wall motion abnormalities.  2. The right ventricle has normal systolc function. The cavity was normal. There is no increase in right ventricular wall thickness. Right ventricular systolic pressure is normal with an estimated pressure of 36.0 mmHg.  3. The aortic valve was not well visualized.  4. Pulmonic valve regurgitation was not assessed by color flow Doppler.  FINDINGS  Left Ventricle: The left ventricle has hyperdynamic systolic function, with an ejection fraction of >65%. The cavity size was normal. There is moderately increased left ventricular wall thickness. No evidence of left ventricular regional wall motion  abnormalities.  Right Ventricle: The right ventricle has normal systolic function. The cavity was normal. There is no increase in right ventricular wall thickness. Right ventricular systolic pressure is normal with an estimated pressure of 36.0 mmHg.   has a past medical history of Arthritis, Dyspnea, Heart murmur, Hyperlipidemia, Hypertension, Pneumonia due to COVID-19 virus (04/30/2019), and Pre-diabetes.   reports that she has never smoked. She has never used smokeless tobacco.  No past surgical history on file.  No Known Allergies   There  is no immunization history on file for this patient.  OV 08/26/2019  Subjective:  Patient ID: Jamie Burton, female , DOB: 1965-09-13 , age 28 y.o. , MRN: [id number] , ADDRESS: [address] [place] [address]   08/26/2019 -   Chief Complaint  Patient presents with  . Follow-up    Pt states she has been doing well since last visit and denies any complaints.   Follow-up post Covid  HPI Jamie Burton 54 y.o. -presents with her case Web designer.  She tells me that since her last visit in September 2020 her shortness of breath is improved but still not back to baseline.  Back in September 2020 when I walked 185 feet x 3 laps.  She stopped 6 times even in the first lap although she did not desaturate.  Since then she has been attending approximately 6 glasses of rehabilitation.  Today she walked 185 feet x 3 laps and she stopped only 1 time of the first lap..  She almost walked 150-170 feet without stopping.  This is an improvement.  Nevertheless she is not back to baseline.  She continues on Eliquis for her presumed pulmonary embolism.  Her D-dimer in September 2020 was significantly improved but still elevated at 1.1.  She has not been diagnosed with sleep apnea and she is on CPAP under Dr. Val Eagle with nighttime oxygen.  This is been going on since September 2020.  She feels she can go to work but she still wants to attend pulmonary rehabilitation and she still not back to baseline.  The case manager is saying okay to go to work but wants me to specify restrictions.  She is interested in the high-resolution CT chest after our discussion based on the fact that she is still short of breath.  She had questions about the COVID-19 vaccine.  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   Of note she is on albuterol inhaler.  She saw the pharmacist.  Who thought that maybe she needs to be on a schedule inhaler but she does not have any formal diagnosis of  asthma.  She says the inhaler does not necessarily help her.  She gets short of breath with exertion and then relieved by rest.  We discussed and we agreed we would just leave her on albuterol inhaler and after pulmonary function test to reassess if she needs to be on any inhaler at all. ROS - per HPI  OV 12/11/2019  Subjective:  Patient ID: Jamie Burton, female , DOB: 03-07-1966 , age 46 y.o. , MRN: [id number] , ADDRESS: [address] [place] [address]   12/11/2019 -   Chief Complaint  Patient presents with  . Follow-up    Pt states she has been doing good since last visit.     HPI Jamie Burton 54 y.o. -returns with her case Production designer, theatre/television/film.  Overall she is doing well.  She tried to do pulmonary rehab but plantar  fasciitis intervened and she cannot do pulmonary rehab anymore.  She continues on Eliquis.  Her D-dimer has been improving but is not normal yet.  There is no shortness of breath.  She is back at work.  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  She is doing her CPAP for sleep apnea.  Overall it seems at this point in time beyond her obesity and plantar fasciitis and sleep apnea she is at baseline.  In fact she feels she is at her pre-Covid baseline.  She had a CT scan of the chest that did not show any evidence of residual fibrosis.   Results for LUNDEN, STIEBER (MRN [id number]) as of 12/11/2019 12:26  Ref. Range 04/30/2019 23:30 05/01/2019 08:55 05/02/2019 06:33 05/03/2019 03:02 05/04/2019 04:36 05/05/2019 04:41 05/27/2019 11:53  D-Dimer, Sharene Butters Latest Ref Range: <0.50 mcg/mL FEU >20.00 (H) >20.00 (H) >20.00 (H) 19.29 (H) 12.70 (H) 9.90 (H) 1.11 (H)   IMPRESSION: 1. No compelling findings of interstitial lung disease on this limited motion degraded scan.  2. Mild cardiomegaly. Borderline prominent main pulmonary artery, cannot exclude pulmonary arterial hypertension. 3. Small hiatal hernia. 4. Right adrenal adenoma.   Electronically Signed   By: Delbert Phenix M.D.   On:  09/30/2019 16:37  ROS - per HPI  OV 06/25/2020  Subjective:  Patient ID: Jamie Burton, female , DOB: [birth year] , age 5 y.o. , MRN: [id number] , ADDRESS: [address] [place] [address] PCP Richardean Chimera, MD Patient Care Team: Richardean Chimera, MD as PCP - General (Family Medicine)  This Provider for this visit: Treatment Team:  Attending Provider: Kalman Shan, MD    06/25/2020 -   Chief Complaint  Patient presents with  . Follow-up    shortness of breath with activity     HPI Jamie Burton 54 y.o. -post COVID on low-dose Eliquis at this point.  It is almost 1 year since she had a Covid.  She says she is doing well.  She is back at work.  She does all her work except lifting heavy objects.  Most recent visit the D-dimer was almost normal so we reduced it to low-dose 2.5 mg Eliquis twice daily.  This because the D-dimer was 0.5 and slightly above the normal range.  She has not had any DVTs or PEs.  She says that she has shortness of breath with exertion is variable depending on the day.  It still worse than the pre-Covid baseline but she says this is definitely improved.  She says when she exerts she checks oxygen level and she does not drop.  Overall she is doing well.     ROS - per HPI     has a past medical history of Arthritis, Dyspnea, Heart murmur, Hyperlipidemia, Hypertension, Pneumonia due to COVID-19 virus (04/30/2019), and Pre-diabetes.   reports that she has never smoked. She has never used smokeless tobacco.  History reviewed. No pertinent surgical history.  No Known Allergies  Immunization History  Administered Date(s) Administered  . Influenza Inj Mdck Quad Pf 06/10/2020  . Influenza,inj,Quad PF,6+ Mos 05/27/2019  . Moderna SARS-COVID-2 Vaccination 10/22/2019, 12/03/2019    Family History  Problem Relation Age of Onset  . Hypertension Other      Current Outpatient Medications:  .  albuterol (VENTOLIN HFA) 108 (90 Base) MCG/ACT inhaler,  Inhale 2 puffs into the lungs every 6 (six) hours as needed for wheezing or shortness of breath., Disp: 6.7 g, Rfl: 0 .  atenolol (TENORMIN)  50 MG tablet, Take 50 mg by mouth daily., Disp: , Rfl:  .  diltiazem (CARDIZEM CD) 180 MG 24 hr capsule, Take 180 mg by mouth daily., Disp: , Rfl:  .  ELIQUIS 2.5 MG TABS tablet, TAKE 1 TABLET BY MOUTH TWICE DAILY, Disp: 60 tablet, Rfl: 0 .  furosemide (LASIX) 40 MG tablet, Take 40 mg by mouth daily., Disp: , Rfl:  .  lovastatin (MEVACOR) 20 MG tablet, Take 40 mg by mouth daily. , Disp: , Rfl:       Objective:   Vitals:   06/25/20 1642  BP: 134/76  Pulse: 75  Temp: 97.9 F (36.6 C)  TempSrc: Other (Comment)  SpO2: 98%  Weight: (!) 405 lb (183.7 kg)  Height: 5\' 5"  (1.651 m)    Estimated body mass index is 67.4 kg/m as calculated from the following:   Height as of this encounter: 5\' 5"  (1.651 m).   Weight as of this encounter: 405 lb (183.7 kg).  @WEIGHTCHANGE @  Filed Weights   06/25/20 1642  Weight: (!) 405 lb (183.7 kg)     Physical Exam   General: No distress. Obese Neuro: Alert and Oriented x 3. GCS 15. Speech normal Psych: Pleasant Resp:  Barrel Chest - no.  Wheeze - no, Crackles - no, No overt respiratory distress CVS: Normal heart sounds. Murmurs - no Ext: Stigmata of Connective Tissue Disease - no HEENT: Normal upper airway. PEERL +. No post nasal drip        Assessment:       ICD-10-CM   1. Elevated d-dimer  R79.89 D-Dimer, Quantitative    D-Dimer, Quantitative       Plan:     Patient Instructions  Pneumonia due to COVID-19 virus Physical Deconditioning causing shortness of breath - iimproved/resolved findings in lung  - at this point general health of weight, plantar fascitis and sleep apnea are dominant issue - too bad plantar fascitis is preventing rehab  plan - focus on weight loss  - continue cPAP  Elevated d-dimer and presumed PE at hospital (CT chest was not possible at hospital) - directly  related to COVID-19 - improved late sept 2020  - near normal d-dimer April 2021 and reduced to low dose eliquis  Plan - check d-dimer  06/25/2020  - if d-dimer slightly high - might just do low dose eliqiuis for another 6 months  - if d-dimer normal - might just do baby aspiriin for 6 months   Now confirmed sleep apnea  -glad you are on CPAP and night o2 since nov 2020  - continue care with Dr May 2021   Followup Based on d-dimer     SIGNATURE    Dr. 06/27/2020, M.D., F.C.C.P,  Pulmonary and Critical Care Medicine Staff Physician, [facility] Center Director - Interstitial Lung Disease  Program  Pulmonary Fibrosis [facility] at [facility], [facility], [facility]  Pager: [phone], If no answer or between  15:00h - 7:00h: call [phone] Telephone: [phone]  5:50 PM 06/25/2020

## 2020-06-26 ENCOUNTER — Telehealth: Payer: Self-pay | Admitting: Internal Medicine

## 2020-06-26 MED ORDER — APIXABAN 2.5 MG PO TABS: 2.5000 mg | ORAL_TABLET | Freq: Two times a day (BID) | ORAL | 5 refills | Status: DC

## 2020-06-26 NOTE — Telephone Encounter (Signed)
Called and spoke with pt letting her know the results of labwork and recommendations per MR. Pt stated that she would rather stay on the 2.5mg  eliquis bid and reassess in 6 months. Refill for pt's eliquis has been sent to preferred pharmacy for pt and also recall has been placed. Nothing further needed.

## 2020-06-26 NOTE — Telephone Encounter (Signed)
D-dimer still 0.52. Now completed 1 year of eliquis for presumed VTE due to covid. Didimer still hanging around 0.5 range upper limit of normal.  Plan  - she can change to baby aspirin or continue eliquis 2.5mg  bid for another 6 months and reassess. Hard to make a sold recommendation due to lack of complete scientific knowledge. So, is a judgement call

## 2020-08-25 IMAGING — CT CT CHEST HIGH RESOLUTION W/O CM
3 of 8 series · 15 of 36 positions shown, 18 images · non-contrast
Comparison: 05/27/2019 chest radiograph.

CLINICAL DATA: [id number] pneumonia survivor in April 2019.
Chronic dyspnea. CPAP use. Anticoagulated for presumed pulmonary
embolism.

EXAM:
CT CHEST WITHOUT CONTRAST
TECHNIQUE: Multidetector CT imaging of the chest was performed following the
standard protocol without intravenous contrast. High resolution
imaging of the lungs, as well as inspiratory and expiratory imaging,
was performed.

[Series 4: thorax · axial · 0.64mm/px · z∈[-220,-170]mm · 2 of 150 slices shown]
[im 25/150  lung]
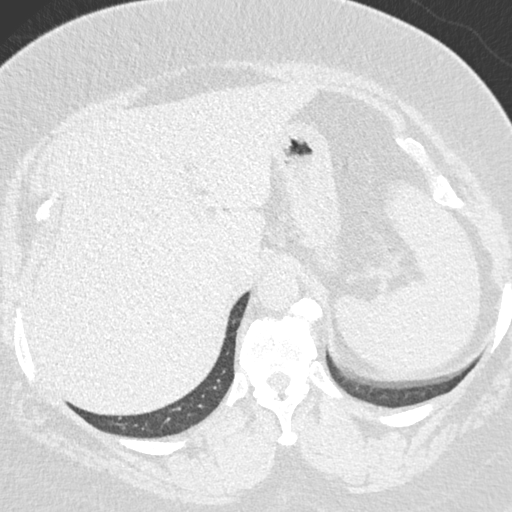
[im 50/150  lung]
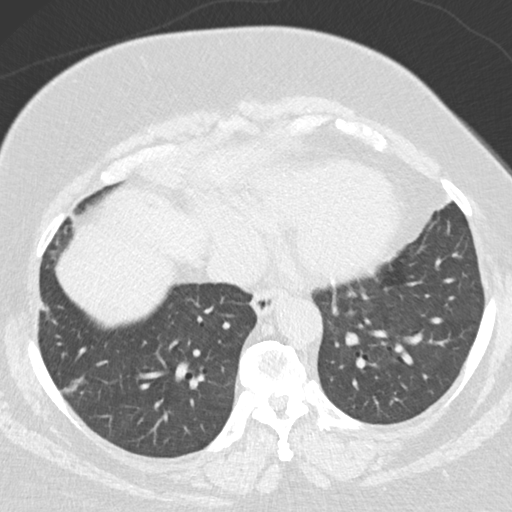

[Series 7: high resolution retro · axial · 0.64mm/px · z∈[-224,+4]mm · 10 of 280 slices shown, 13 images]
[im 26/280  mediastinal]
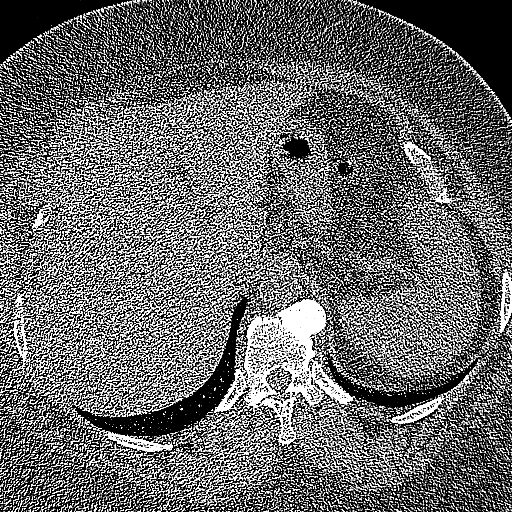
[im 26/280  lung]
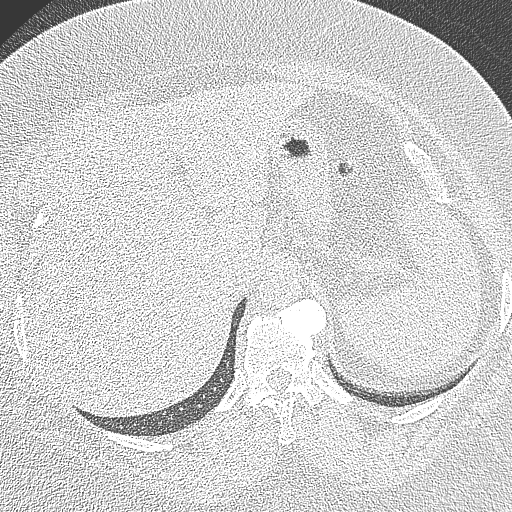
[im 51/280  lung]
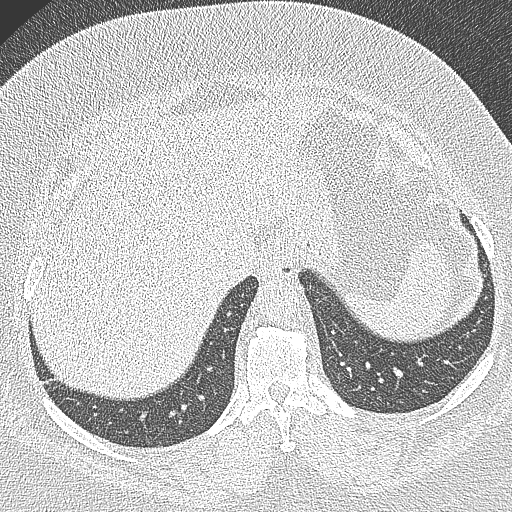
[im 77/280  lung]
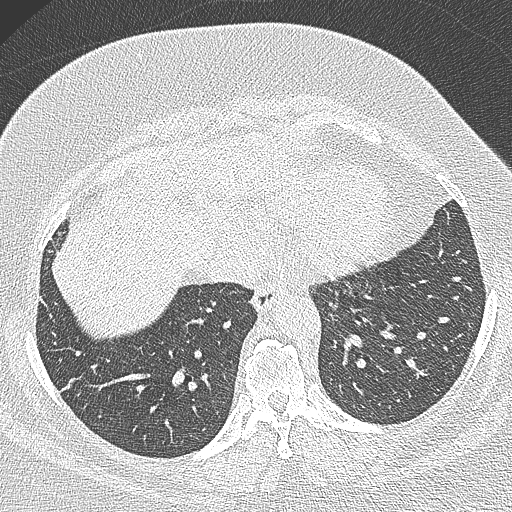
[im 102/280  lung]
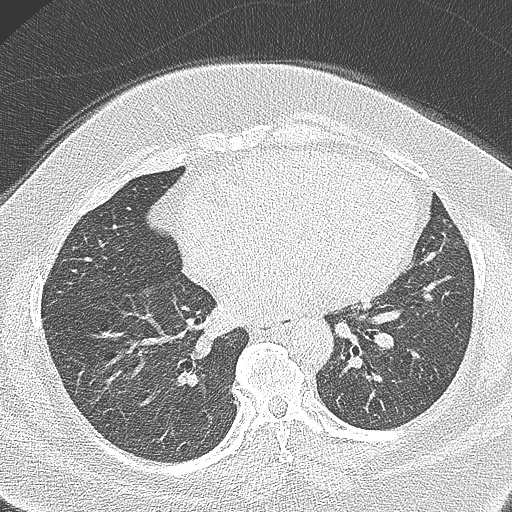
[im 127/280  mediastinal]
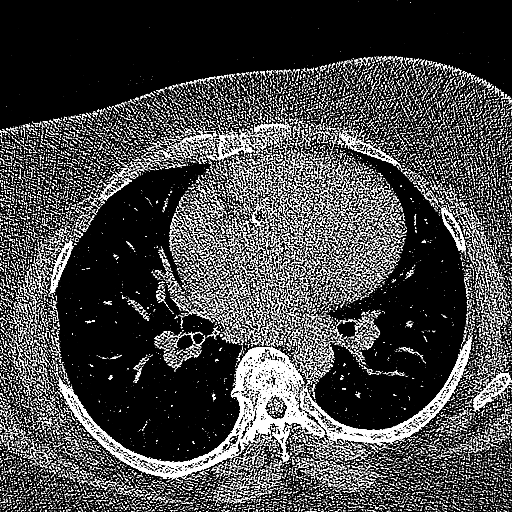
[im 127/280  lung]
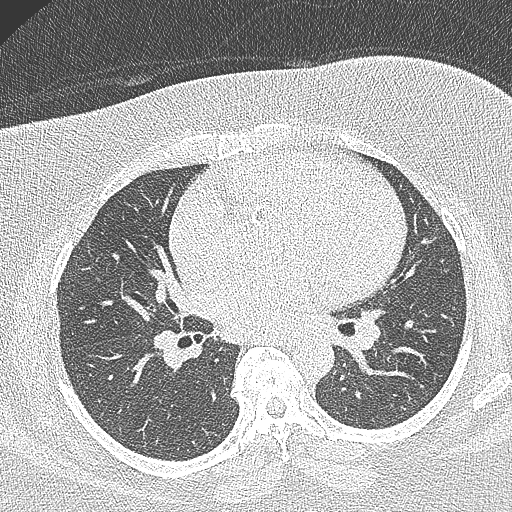
[im 153/280  lung]
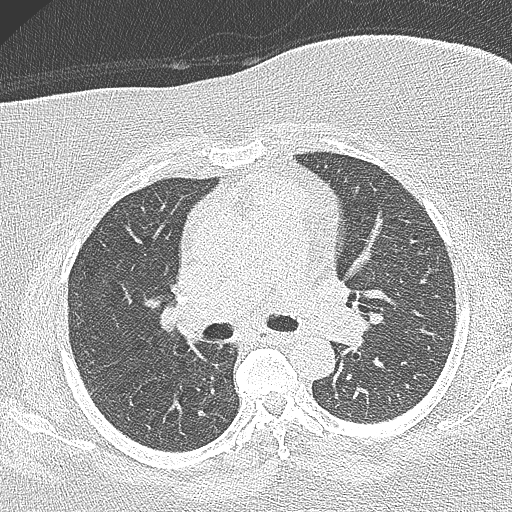
[im 178/280  lung]
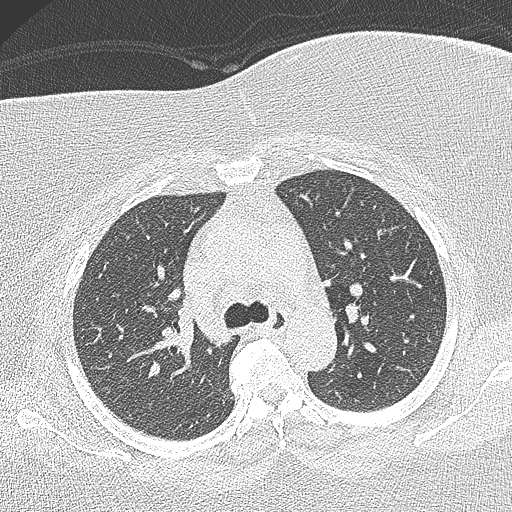
[im 203/280  lung]
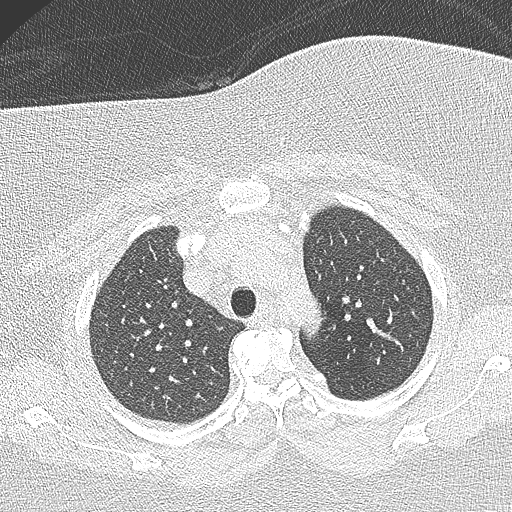
[im 229/280  mediastinal]
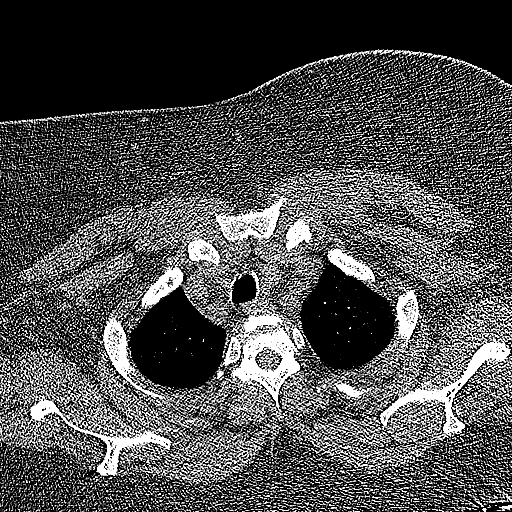
[im 229/280  lung]
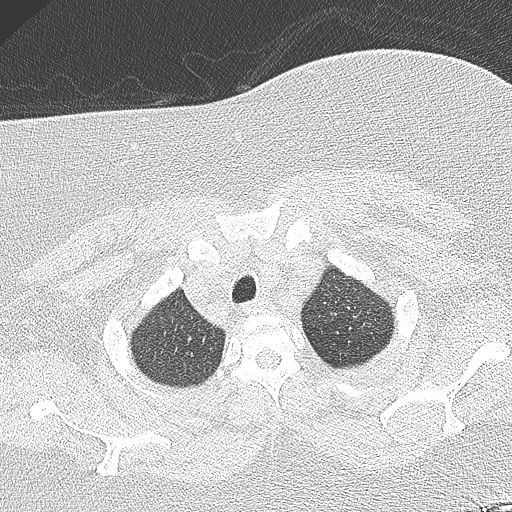
[im 254/280  lung]
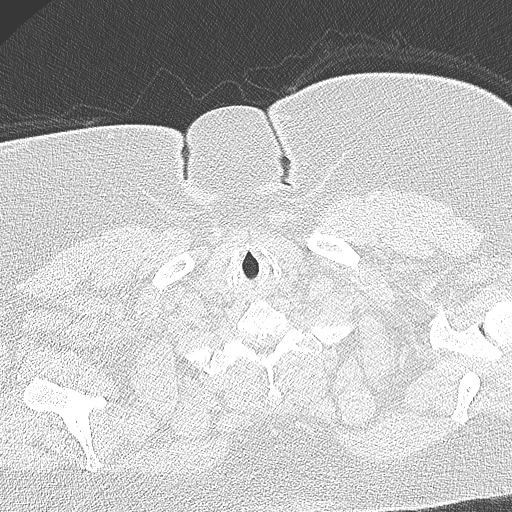

[Series 8: coronal · coronal · 0.58mm/px · 3 of 131 slices shown]
[im 27/131  lung]
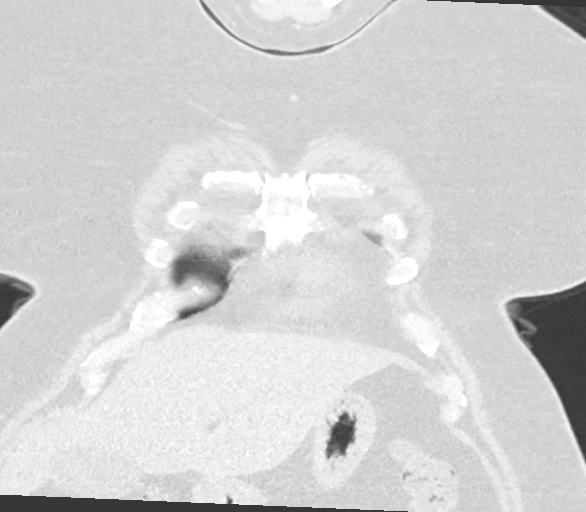
[im 53/131  lung]
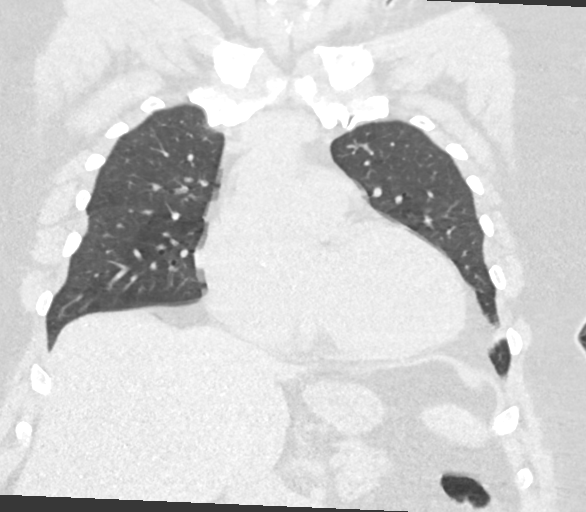
[im 79/131  lung]
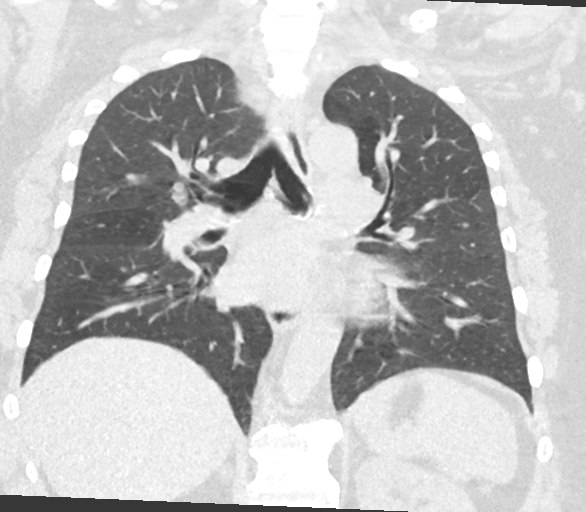

[15 of 36 positions shown; findings below may reference images not displayed]

FINDINGS: Motion degraded scan, limiting assessment.

Cardiovascular: Mild cardiomegaly. No significant pericardial
effusion/thickening. Normal course and caliber of the thoracic
aorta. Borderline prominent main pulmonary artery (3.4 cm diameter).

Mediastinum/Nodes: No discrete thyroid nodules. Unremarkable
esophagus. No pathologically enlarged axillary, mediastinal or hilar
lymph nodes, noting limited sensitivity for the detection of hilar
adenopathy on this noncontrast study.

Lungs/Pleura: No pneumothorax. No pleural effusion. No central
airway stenoses. No acute consolidative airspace disease, lung
masses or significant pulmonary nodules. No significant air trapping
or evidence of tracheobronchomalacia on the expiration sequence. A
few scattered small parenchymal bands scattered in lung bases
bilaterally. No significant regions of subpleural reticulation,
ground-glass attenuation, traction bronchiectasis, architectural
distortion or frank honeycombing.

Upper abdomen: Small hiatal hernia. Right adrenal 1.4 cm nodule with
density -8 HU, compatible with a benign adenoma. Simple 1.5 cm upper
left renal cyst.

Musculoskeletal: No aggressive appearing focal osseous lesions.
Marked thoracic spondylosis.
IMPRESSION: 1. No compelling findings of interstitial lung disease on this
limited motion degraded scan.
2. Mild cardiomegaly. Borderline prominent main pulmonary artery,
cannot exclude pulmonary arterial hypertension.
3. Small hiatal hernia.
4. Right adrenal adenoma.

## 2020-12-01 ENCOUNTER — Other Ambulatory Visit: Payer: Self-pay | Admitting: Internal Medicine

## 2020-12-15 ENCOUNTER — Ambulatory Visit (INDEPENDENT_AMBULATORY_CARE_PROVIDER_SITE_OTHER): Payer: No Typology Code available for payment source | Admitting: Internal Medicine

## 2020-12-15 ENCOUNTER — Telehealth: Payer: Self-pay | Admitting: Internal Medicine

## 2020-12-15 ENCOUNTER — Other Ambulatory Visit: Payer: Self-pay

## 2020-12-15 ENCOUNTER — Encounter: Payer: Self-pay | Admitting: Internal Medicine

## 2020-12-15 VITALS — BP 130/84 | HR 78 | Temp 97.8°F | Ht 65.0 in | Wt >= 6400 oz

## 2020-12-15 DIAGNOSIS — G4733 Obstructive sleep apnea (adult) (pediatric): Secondary | ICD-10-CM | POA: Diagnosis not present

## 2020-12-15 DIAGNOSIS — R0600 Dyspnea, unspecified: Secondary | ICD-10-CM

## 2020-12-15 DIAGNOSIS — U071 COVID-19: Secondary | ICD-10-CM | POA: Diagnosis not present

## 2020-12-15 DIAGNOSIS — R7989 Other specified abnormal findings of blood chemistry: Secondary | ICD-10-CM

## 2020-12-15 DIAGNOSIS — J1282 Pneumonia due to coronavirus disease 2019: Secondary | ICD-10-CM

## 2020-12-15 DIAGNOSIS — R0609 Other forms of dyspnea: Secondary | ICD-10-CM

## 2020-12-15 DIAGNOSIS — Z9989 Dependence on other enabling machines and devices: Secondary | ICD-10-CM

## 2020-12-15 DIAGNOSIS — Z8616 Personal history of COVID-19: Secondary | ICD-10-CM | POA: Diagnosis not present

## 2020-12-15 LAB — D-DIMER, QUANTITATIVE: D-Dimer, Quant: 0.48 mcg/mL FEU (ref <0.50)

## 2020-12-15 MED ORDER — ALBUTEROL SULFATE HFA 108 (90 BASE) MCG/ACT IN AERS: 2.0000 | INHALATION_SPRAY | Freq: Four times a day (QID) | RESPIRATORY_TRACT | 0 refills | Status: AC | PRN

## 2020-12-15 NOTE — Progress Notes (Signed)
OV 05/27/2019  Subjective:  Patient ID: Jamie Burton, female , DOB: Jul 16, 1966 , age 55 y.o. , MRN: [id number] , ADDRESS: [address] [place] [address]   05/27/2019 -   Chief Complaint  Patient presents with  . Consult    hospital follow-up.  pt treated for covid, pna.  pt has good and bad days, still experiencing sob with exertion.      HPI Jamie Burton 55 y.o. -presents to the pulmonary clinic for new evaluation.  There is a post COVID 19 survivor.  Review of the chart indicates that she was admitted early September 2020.  At this point in time she is over 21 days since her first symptom.  She was admitted for 5 or 6 days at [facility].  I discussed with the hospitalist Dr. Thedore Mins.  He clearly remembers the case.  He says she was extremely hypoxemic on arrival and had an extremely high d-dimer.  She was treated with Decadron and REMdesivir.  He said that because the D-dimers was extremely high pulmonary embolism was suspected but because of either renal function or CT scan unavailability in the duplex legs being of poor quality a pulmonary embolism was presumed and she was started on IV heparin full dose anticoagulation.  The next day she improved remarkably with her hypoxemia making them for the suspect pulmonary embolism.  She is then been discharged on 6 months to 3 months of Eliquis.  Patient is here for follow-up.  At this point in time she tells me that she is much better but she still has exertional dyspnea.  She also has severe exertional fatigue no cough.  Walking desaturation test 185 feet x 3 laps on room air: She was only able to complete 1 lap and even for this she had to stop 6 times.  She did not desaturate.  She says sometimes at home she desaturates.  She has extreme tiredness.  Since her last visit she saw her primary care physician.  I reviewed those notes.  CBC and chemistries are normal.  Her most recent chest x-ray at the time of discharge showed  some pulmonary infiltrates.   Her echo from the hospital is also reviewed.  She is here with the case manager and they had multiple questions.  All of which was answered.     ROS - per HPI  Results for Jamie Burton (MRN [id number]) as of 05/27/2019 11:01  Ref. Range 04/30/2019 23:30 05/01/2019 08:55 05/02/2019 06:33 05/03/2019 03:02 05/04/2019 04:36 05/05/2019 04:41 9/28  D-Dimer, Quant Latest Ref Range: 0.00 - 0.50 ug/mL-FEU >20.00 (H) >20.00 (H) >20.00 (H) 19.29 (H) 12.70 (H) 9.90 (H) 1.11     ECHO 05/01/2019 1. The left ventricle has hyperdynamic systolic function, with an ejection fraction of >65%. The cavity size was normal. There is moderately increased left ventricular wall thickness. No evidence of left ventricular regional wall motion abnormalities.  2. The right ventricle has normal systolc function. The cavity was normal. There is no increase in right ventricular wall thickness. Right ventricular systolic pressure is normal with an estimated pressure of 36.0 mmHg.  3. The aortic valve was not well visualized.  4. Pulmonic valve regurgitation was not assessed by color flow Doppler.  FINDINGS  Left Ventricle: The left ventricle has hyperdynamic systolic function, with an ejection fraction of >65%. The cavity size was normal. There is moderately increased left ventricular wall thickness. No evidence of left ventricular regional wall motion  abnormalities.  Right Ventricle: The right ventricle has normal systolic function. The cavity was normal. There is no increase in right ventricular wall thickness. Right ventricular systolic pressure is normal with an estimated pressure of 36.0 mmHg.   has a past medical history of Arthritis, Dyspnea, Heart murmur, Hyperlipidemia, Hypertension, Pneumonia due to COVID-19 virus (04/30/2019), and Pre-diabetes.   reports that she has never smoked. She has never used smokeless tobacco.  No past surgical history on file.  No Known Allergies   There  is no immunization history on file for this patient.  OV 08/26/2019  Subjective:  Patient ID: Jamie Burton, female , DOB: 1965-09-13 , age 28 y.o. , MRN: [id number] , ADDRESS: [address] [place] [address]   08/26/2019 -   Chief Complaint  Patient presents with  . Follow-up    Pt states she has been doing well since last visit and denies any complaints.   Follow-up post Covid  HPI Jamie Burton 55 y.o. -presents with her case Web designer.  She tells me that since her last visit in September 2020 her shortness of breath is improved but still not back to baseline.  Back in September 2020 when I walked 185 feet x 3 laps.  She stopped 6 times even in the first lap although she did not desaturate.  Since then she has been attending approximately 6 glasses of rehabilitation.  Today she walked 185 feet x 3 laps and she stopped only 1 time of the first lap..  She almost walked 150-170 feet without stopping.  This is an improvement.  Nevertheless she is not back to baseline.  She continues on Eliquis for her presumed pulmonary embolism.  Her D-dimer in September 2020 was significantly improved but still elevated at 1.1.  She has not been diagnosed with sleep apnea and she is on CPAP under Dr. Val Eagle with nighttime oxygen.  This is been going on since September 2020.  She feels she can go to work but she still wants to attend pulmonary rehabilitation and she still not back to baseline.  The case manager is saying okay to go to work but wants me to specify restrictions.  She is interested in the high-resolution CT chest after our discussion based on the fact that she is still short of breath.  She had questions about the COVID-19 vaccine.  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   Of note she is on albuterol inhaler.  She saw the pharmacist.  Who thought that maybe she needs to be on a schedule inhaler but she does not have any formal diagnosis of  asthma.  She says the inhaler does not necessarily help her.  She gets short of breath with exertion and then relieved by rest.  We discussed and we agreed we would just leave her on albuterol inhaler and after pulmonary function test to reassess if she needs to be on any inhaler at all. ROS - per HPI  OV 12/11/2019  Subjective:  Patient ID: Jamie Burton, female , DOB: 03-07-1966 , age 46 y.o. , MRN: [id number] , ADDRESS: [address] [place] [address]   12/11/2019 -   Chief Complaint  Patient presents with  . Follow-up    Pt states she has been doing good since last visit.     HPI Jamie Burton 55 y.o. -returns with her case Production designer, theatre/television/film.  Overall she is doing well.  She tried to do pulmonary rehab but plantar  fasciitis intervened and she cannot do pulmonary rehab anymore.  She continues on Eliquis.  Her D-dimer has been improving but is not normal yet.  There is no shortness of breath.  She is back at work.  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  She is doing her CPAP for sleep apnea.  Overall it seems at this point in time beyond her obesity and plantar fasciitis and sleep apnea she is at baseline.  In fact she feels she is at her pre-Covid baseline.  She had a CT scan of the chest that did not show any evidence of residual fibrosis.   Results for KYRIN, GARN (MRN [id number]) as of 12/11/2019 12:26  Ref. Range 04/30/2019 23:30 05/01/2019 08:55 05/02/2019 06:33 05/03/2019 03:02 05/04/2019 04:36 05/05/2019 04:41 05/27/2019 11:53  D-Dimer, Sharene Butters Latest Ref Range: <0.50 mcg/mL FEU >20.00 (H) >20.00 (H) >20.00 (H) 19.29 (H) 12.70 (H) 9.90 (H) 1.11 (H)   IMPRESSION: 1. No compelling findings of interstitial lung disease on this limited motion degraded scan.  2. Mild cardiomegaly. Borderline prominent main pulmonary artery, cannot exclude pulmonary arterial hypertension. 3. Small hiatal hernia. 4. Right adrenal adenoma.   Electronically Signed   By: Delbert Phenix M.D.   On:  09/30/2019 16:37  ROS - per HPI  OV 06/25/2020  Subjective:  Patient ID: Jamie Burton, female , DOB: 07-12-1966 , age 48 y.o. , MRN: [id number] , ADDRESS: [address] [place] [address] PCP Richardean Chimera, MD Patient Care Team: Richardean Chimera, MD as PCP - General (Family Medicine)  This Provider for this visit: Treatment Team:  Attending Provider: Kalman Shan, MD    06/25/2020 -   Chief Complaint  Patient presents with  . Follow-up    shortness of breath with activity     HPI Jamie Burton 55 y.o. -post COVID on low-dose Eliquis at this point.  It is almost 1 year since she had a Covid.  She says she is doing well.  She is back at work.  She does all her work except lifting heavy objects.  Most recent visit the D-dimer was almost normal so we reduced it to low-dose 2.5 mg Eliquis twice daily.  This because the D-dimer was 0.5 and slightly above the normal range.  She has not had any DVTs or PEs.  She says that she has shortness of breath with exertion is variable depending on the day.  It still worse than the pre-Covid baseline but she says this is definitely improved.  She says when she exerts she checks oxygen level and she does not drop.  Overall she is doing well.     ROS - per HPI    OV 12/15/2020  Subjective:  Patient ID: Jamie Burton, female , DOB: 05/25/1966 , age 64 y.o. , MRN: [id number] , ADDRESS: [address] [place] [address] PCP Richardean Chimera, MD Patient Care Team: Richardean Chimera, MD as PCP - General (Family Medicine)  This Provider for this visit: Treatment Team:  Attending Provider: Kalman Shan, MD    12/15/2020 -   Chief Complaint  Patient presents with  . Follow-up    Doing ok, wheezing at times     HPI Jamie Burton 55 y.o. -post COVID and presumed pulmonary embolism follow-up.  This September 2022 she will approach 2 years since her COVID-19.  At this point in time as of 1 year ago February 2021 her pulm infiltrates  have resolved.  She is not on  oxygen.  She is just on CPAP for sleep apnea.  She suffers from obesity.  Her residual dyspnea is been deemed due to obesity and physical deconditioning.  At last visit we checked a D-dimer was borderline elevated.  I gave her the option of stopping low-dose Eliquis and switching to baby aspirin but she decided to remain on low-dose Eliquis.  She returns for follow-up.  Since her last visit she continues to be active at work.  She is mostly in a sedentary job.  She used to do some lifting and stocking of items but this is being done by others.  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  Her caseworker is with her.  Caseworker reports that patient is on albuterol which she uses occasionally with exertion.  Prior to COVID-19 the albuterol use is nonexistent.  I agree that this possible element of mild postviral reactive airways is requiring the albuterol and the albuterol use could be related to COVID-19 but otherwise at this point mostly its residual dyspnea from obesity and physical deconditioning.  She is significantly improved.  Her current symptom score and walking desaturation test is shown below.  She does feel refreshed using CPAP.  She will have another D-dimer today  She had recent labs with her primary care physician hemoglobin A1c was 6.2.  CBC and chemistry was normal.  She cannot do rehab due to knee and feet issues   SYMPTOM SCALE -post covid 12/15/2020   O2 use cpap qhs . No O2 use  Shortness of Breath 0 -> 5 scale with 5 being worst (score 6 If unable to do)  At rest 0  Simple tasks - showers, clothes change, eating, shaving 2  Household (dishes, doing bed, laundry) 2  Shopping 2  Walking level at own pace 3  Walking up [employer] na  Total (30-36) Dyspnea Score 9  How bad is your cough? 0  How bad is your fatigue 0  How bad is nausea 0  How bad is vomiting?  0  How bad is diarrhea? 0  How bad is anxiety? 0  How bad  is depression 0    Simple office walk 185 feet x  3 laps goal with forehead probe 12/15/2020   O2 used ra  Number laps completed 2 of 3.   Comments about pace slow  Resting Pulse Ox/HR 99% and 78/min  Final Pulse Ox/HR 94% and 110/min  Desaturated </= 88% no  Desaturated <= 3% points Yes, 5 points  Got Tachycardic >/= 90/min yes  Symptoms at end of test Very winded, slow pace  Miscellaneous comments obesity       PFT  PFT Results Latest Ref Rng & Units 11/25/2019  FVC-Pre L 2.32  FVC-Predicted Pre % 75  FVC-Post L 2.22  FVC-Predicted Post % 72  Pre FEV1/FVC % % 92  Post FEV1/FCV % % 90  FEV1-Pre L 2.14  FEV1-Predicted Pre % 86  FEV1-Post L 2.00  DLCO uncorrected ml/min/mmHg 16.21  DLCO UNC% % 72  DLCO corrected ml/min/mmHg 16.21  DLCO COR %Predicted % 72  DLVA Predicted % 122  TLC L 4.00  TLC % Predicted % 74  RV % Predicted % 85   Ct chest feb 2021   IMPRESSION: 1. No compelling findings of interstitial lung disease on this limited motion degraded scan. 2. Mild cardiomegaly. Borderline prominent main pulmonary artery, cannot exclude pulmonary arterial hypertension. 3. Small hiatal hernia. 4. Right adrenal adenoma.  Electronically Signed   By: Delbert Phenix M.D.   On: 09/30/2019 16:37     has a past medical history of Arthritis, Dyspnea, Heart murmur, Hyperlipidemia, Hypertension, Pneumonia due to COVID-19 virus (04/30/2019), and Pre-diabetes.   reports that she has never smoked. She has never used smokeless tobacco.  History reviewed. No pertinent surgical history.  No Known Allergies  Immunization History  Administered Date(s) Administered  . Influenza Inj Mdck Quad Pf 06/10/2020  . Influenza,inj,Quad PF,6+ Mos 05/27/2019  . Moderna Sars-Covid-2 Vaccination 10/22/2019, 12/03/2019    Family History  Problem Relation Age of Onset  . Hypertension Other      Current Outpatient Medications:  .  atenolol (TENORMIN) 50 MG tablet, Take 50 mg  by mouth daily., Disp: , Rfl:  .  diltiazem (CARDIZEM CD) 180 MG 24 hr capsule, Take 180 mg by mouth daily., Disp: , Rfl:  .  DULoxetine (CYMBALTA) 30 MG capsule, Take 30 mg by mouth daily., Disp: , Rfl:  .  ELIQUIS 2.5 MG TABS tablet, TAKE 1 TABLET BY MOUTH TWICE DAILY, Disp: 60 tablet, Rfl: 0 .  furosemide (LASIX) 40 MG tablet, Take 40 mg by mouth daily., Disp: , Rfl:  .  gabapentin (NEURONTIN) 300 MG capsule, Take 1 capsule by mouth 2 (two) times daily., Disp: , Rfl:  .  lovastatin (MEVACOR) 20 MG tablet, Take 40 mg by mouth daily. , Disp: , Rfl:  .  albuterol (VENTOLIN HFA) 108 (90 Base) MCG/ACT inhaler, Inhale 2 puffs into the lungs every 6 (six) hours as needed for wheezing or shortness of breath., Disp: 6.7 g, Rfl: 0      Objective:   Vitals:   12/15/20 0912  BP: 130/84  Pulse: 78  Temp: 97.8 F (36.6 C)  TempSrc: Oral  SpO2: 99%  Weight: (!) 419 lb 3.2 oz (190.1 kg)  Height: 5\' 5"  (1.651 m)    Estimated body mass index is 69.76 kg/m as calculated from the following:   Height as of this encounter: 5\' 5"  (1.651 m).   Weight as of this encounter: 419 lb 3.2 oz (190.1 kg).  @WEIGHTCHANGE @    12/15/20 0912  Weight: (!) 419 lb 3.2 oz (190.1 kg)     Physical Exam    General: No distress. obese Neuro: Alert and Oriented x 3. GCS 15. Speech normal Psych: Pleasant Resp:  Barrel Chest - no.  Wheeze - no, Crackles - no, No overt respiratory distress CVS: Normal heart sounds. Murmurs - no Ext: Stigmata of Connective Tissue Disease - no HEENT: Normal upper airway. PEERL +. No post nasal drip        Assessment:       ICD-10-CM   1. Elevated d-dimer  R79.89 D-Dimer, Quantitative  2. OSA on CPAP  G47.33    Z99.89   3. Dyspnea on exertion  R06.00   4. Pneumonia due to COVID-19 virus  U07.1    J12.82   5. Morbid obesity (HCC)  E66.01        Plan:     Patient Instructions  Pneumonia due to COVID-19 virus Physical Deconditioning causing shortness  of breath -  CT chest feb 2021 resolved findings in lung  - at this point  Shortness of breath is due to weight and physical deconditioning - though albutero use could reflect post covid mild reactive airways - glad is mild - too bad plantar fascitis is preventing rehab  plan - focus on weight loss  - continue cPAP  Elevated d-dimer and presumed PE at hospital (CT chest was not possible at hospital) - directly related to COVID-19 - improved late sept 2020  - near normal but slight elevated d-dimer April 2021 and Oct 2021 and reduced to low dose eliquis  Plan - check d-dimer   12/15/2020 - if d-dimer slightly high - might just do low dose eliqiuis for another 6 months  - if d-dimer normal - might just do baby aspiriin for 6 months   Now confirmed sleep apnea  -glad you are on CPAP under care of Dr Val Eagle  Plan   - continue care with Dr Val Eagle   Followup Based on d-dimer     SIGNATURE    Dr. Kalman Shan, M.D., F.C.C.P,  Pulmonary and Critical Care Medicine Staff Physician, [facility] Center Director - Interstitial Lung Disease  Program  Pulmonary Fibrosis [facility] [place], [place], [address]  Pager: [phone], If no answer or between  15:00h - 7:00h: call [phone] Telephone: [phone]  9:43 AM 12/15/2020

## 2020-12-15 NOTE — Telephone Encounter (Signed)
D-dimer normal  Plan  - can go to bbaby aspirin once daily - ultimatedvt risk reduction is to lose weight LMK her deison

## 2020-12-15 NOTE — Patient Instructions (Addendum)
Pneumonia due to COVID-19 virus Physical Deconditioning causing shortness of breath -  CT chest feb 2021 resolved findings in lung  - at this point  Shortness of breath is due to weight and physical deconditioning - though albutero use could reflect post covid mild reactive airways - glad is mild - too bad plantar fascitis is preventing rehab  plan - focus on weight loss  - continue cPAP  Elevated d-dimer and presumed PE at hospital (CT chest was not possible at hospital) - directly related to COVID-19 - improved late sept 2020  - near normal but slight elevated d-dimer April 2021 and Oct 2021 and reduced to low dose eliquis  Plan - check d-dimer   12/15/2020 - if d-dimer slightly high - might just do low dose eliqiuis for another 6 months  - if d-dimer normal - might just do baby aspiriin for 6 months   Now confirmed sleep apnea  -glad you are on CPAP under care of Dr Val Eagle  Plan   - continue care with Dr Val Eagle   Followup Based on d-dimer

## 2020-12-15 NOTE — Telephone Encounter (Signed)
I called and spoke with patient regarding D-dimer results and MR recs. She verbalized understanding and wishes to finish current script of Eliquis since she paid for it then will start baby ASA daily. I told patient I would let MR know and if he wants to change anything, will call patient back. Patient verbalized understanding.  Dr. Marchelle Gearing, are you ok with this plan? Thanks!

## 2020-12-16 NOTE — Telephone Encounter (Signed)
ATC patient x2--call was answer but patient did not speak.  Will call back.

## 2020-12-16 NOTE — Telephone Encounter (Signed)
Yeah this is fine. Thanks. She can hae a tele visit with app in 3 months and then if fine be discharged from followup

## 2020-12-18 ENCOUNTER — Telehealth: Payer: Self-pay | Admitting: Internal Medicine

## 2020-12-18 NOTE — Telephone Encounter (Signed)
Fax received from pt's case manager Jake Michaelis stating that they are needing last OV that pt had with MR on 12/15/20 sent to them. Attempted to call Reitta to verify what fax number pt's OV needed to be sent to but unable to reach. Left her a detailed message and stated to her in the message to provide fax number that we need to send this to. Will await a return call prior to sending this.

## 2020-12-21 NOTE — Telephone Encounter (Signed)
OV note has been faxed to Murvin Donning, Case Manager to the fax number she called back and left. Nothing further is needed.

## 2021-10-03 LAB — EXTERNAL GENERIC LAB PROCEDURE: COLOGUARD: NEGATIVE
